# Patient Record
Sex: Female | Born: 1960
Health system: Southern US, Community
[De-identification: ages and names within clinical notes are randomized; demographics above are authoritative.]

## PROBLEM LIST (undated history)

## (undated) DIAGNOSIS — I10 Essential (primary) hypertension: Secondary | ICD-10-CM

## (undated) HISTORY — PX: MYOMECTOMY: SHX85

## (undated) HISTORY — PX: APPENDECTOMY: SHX54

---

## 1998-03-12 ENCOUNTER — Inpatient Hospital Stay (HOSPITAL_COMMUNITY): Admission: RE | Admit: 1998-03-12 | Discharge: 1998-03-14 | Payer: Self-pay | Admitting: Obstetrics and Gynecology

## 1999-04-23 ENCOUNTER — Other Ambulatory Visit: Admission: RE | Admit: 1999-04-23 | Discharge: 1999-04-23 | Payer: Self-pay | Admitting: Obstetrics and Gynecology

## 1999-04-23 ENCOUNTER — Encounter: Admission: RE | Admit: 1999-04-23 | Discharge: 1999-04-23 | Payer: Self-pay | Admitting: Obstetrics and Gynecology

## 1999-04-23 ENCOUNTER — Encounter: Payer: Self-pay | Admitting: Obstetrics and Gynecology

## 2000-04-25 ENCOUNTER — Encounter: Payer: Self-pay | Admitting: Family Medicine

## 2000-04-25 ENCOUNTER — Encounter: Admission: RE | Admit: 2000-04-25 | Discharge: 2000-04-25 | Payer: Self-pay | Admitting: Family Medicine

## 2000-04-28 ENCOUNTER — Other Ambulatory Visit: Admission: RE | Admit: 2000-04-28 | Discharge: 2000-04-28 | Payer: Self-pay | Admitting: Obstetrics and Gynecology

## 2000-05-03 ENCOUNTER — Encounter: Admission: RE | Admit: 2000-05-03 | Discharge: 2000-05-03 | Payer: Self-pay | Admitting: Obstetrics and Gynecology

## 2000-05-03 ENCOUNTER — Encounter: Payer: Self-pay | Admitting: Obstetrics and Gynecology

## 2001-04-26 ENCOUNTER — Encounter: Payer: Self-pay | Admitting: Obstetrics and Gynecology

## 2001-04-26 ENCOUNTER — Encounter: Admission: RE | Admit: 2001-04-26 | Discharge: 2001-04-26 | Payer: Self-pay | Admitting: Obstetrics and Gynecology

## 2001-05-01 ENCOUNTER — Other Ambulatory Visit: Admission: RE | Admit: 2001-05-01 | Discharge: 2001-05-01 | Payer: Self-pay | Admitting: Obstetrics and Gynecology

## 2002-03-08 ENCOUNTER — Ambulatory Visit (HOSPITAL_BASED_OUTPATIENT_CLINIC_OR_DEPARTMENT_OTHER): Admission: RE | Admit: 2002-03-08 | Discharge: 2002-03-08 | Payer: Self-pay | Admitting: Obstetrics and Gynecology

## 2002-03-08 ENCOUNTER — Encounter (INDEPENDENT_AMBULATORY_CARE_PROVIDER_SITE_OTHER): Payer: Self-pay | Admitting: Specialist

## 2002-05-01 ENCOUNTER — Encounter: Payer: Self-pay | Admitting: Obstetrics and Gynecology

## 2002-05-01 ENCOUNTER — Encounter: Admission: RE | Admit: 2002-05-01 | Discharge: 2002-05-01 | Payer: Self-pay | Admitting: Obstetrics and Gynecology

## 2002-06-05 ENCOUNTER — Other Ambulatory Visit: Admission: RE | Admit: 2002-06-05 | Discharge: 2002-06-05 | Payer: Self-pay | Admitting: Obstetrics and Gynecology

## 2003-05-30 ENCOUNTER — Encounter: Admission: RE | Admit: 2003-05-30 | Discharge: 2003-05-30 | Payer: Self-pay | Admitting: Obstetrics and Gynecology

## 2003-06-06 ENCOUNTER — Other Ambulatory Visit: Admission: RE | Admit: 2003-06-06 | Discharge: 2003-06-06 | Payer: Self-pay | Admitting: Obstetrics and Gynecology

## 2003-06-10 ENCOUNTER — Encounter: Admission: RE | Admit: 2003-06-10 | Discharge: 2003-06-10 | Payer: Self-pay | Admitting: Obstetrics and Gynecology

## 2004-06-09 ENCOUNTER — Encounter: Admission: RE | Admit: 2004-06-09 | Discharge: 2004-06-09 | Payer: Self-pay | Admitting: Obstetrics and Gynecology

## 2005-07-12 ENCOUNTER — Encounter: Admission: RE | Admit: 2005-07-12 | Discharge: 2005-07-12 | Payer: Self-pay | Admitting: Obstetrics and Gynecology

## 2006-07-25 ENCOUNTER — Encounter: Admission: RE | Admit: 2006-07-25 | Discharge: 2006-07-25 | Payer: Self-pay | Admitting: Obstetrics and Gynecology

## 2006-07-29 ENCOUNTER — Encounter: Admission: RE | Admit: 2006-07-29 | Discharge: 2006-07-29 | Payer: Self-pay | Admitting: Obstetrics and Gynecology

## 2006-08-15 ENCOUNTER — Encounter: Admission: RE | Admit: 2006-08-15 | Discharge: 2006-08-15 | Payer: Self-pay | Admitting: Obstetrics and Gynecology

## 2007-08-04 ENCOUNTER — Encounter: Admission: RE | Admit: 2007-08-04 | Discharge: 2007-08-04 | Payer: Self-pay | Admitting: Obstetrics and Gynecology

## 2008-08-13 ENCOUNTER — Encounter: Admission: RE | Admit: 2008-08-13 | Discharge: 2008-08-13 | Payer: Self-pay | Admitting: Family Medicine

## 2009-08-19 ENCOUNTER — Encounter: Admission: RE | Admit: 2009-08-19 | Discharge: 2009-08-19 | Payer: Self-pay | Admitting: Obstetrics and Gynecology

## 2009-09-18 ENCOUNTER — Encounter: Admission: RE | Admit: 2009-09-18 | Discharge: 2009-09-18 | Payer: Self-pay | Admitting: Obstetrics and Gynecology

## 2009-11-19 ENCOUNTER — Emergency Department (HOSPITAL_COMMUNITY): Admission: EM | Admit: 2009-11-19 | Discharge: 2009-11-19 | Payer: Self-pay | Admitting: Emergency Medicine

## 2010-03-01 ENCOUNTER — Encounter: Payer: Self-pay | Admitting: Obstetrics and Gynecology

## 2010-04-21 ENCOUNTER — Observation Stay (HOSPITAL_COMMUNITY)
Admission: EM | Admit: 2010-04-21 | Discharge: 2010-04-23 | Disposition: A | Discharge status: Home / Self Care | Payer: BC Managed Care – PPO | Attending: General Surgery | Admitting: General Surgery

## 2010-04-21 ENCOUNTER — Other Ambulatory Visit: Payer: Self-pay | Admitting: General Surgery

## 2010-04-21 ENCOUNTER — Emergency Department (HOSPITAL_COMMUNITY): Payer: BC Managed Care – PPO

## 2010-04-21 DIAGNOSIS — R509 Fever, unspecified: Secondary | ICD-10-CM | POA: Insufficient documentation

## 2010-04-21 DIAGNOSIS — K3580 Unspecified acute appendicitis: Principal | ICD-10-CM | POA: Insufficient documentation

## 2010-04-21 LAB — DIFFERENTIAL
Basophils Absolute: 0.1 10*3/uL (ref 0.0–0.1)
Basophils Relative: 0 % (ref 0–1)
Eosinophils Absolute: 0 10*3/uL (ref 0.0–0.7)
Eosinophils Relative: 0 % (ref 0–5)
Lymphocytes Relative: 6 % — ABNORMAL LOW (ref 12–46)
Lymphs Abs: 1.2 10*3/uL (ref 0.7–4.0)
Monocytes Absolute: 1.2 10*3/uL — ABNORMAL HIGH (ref 0.1–1.0)
Monocytes Relative: 7 % (ref 3–12)
Neutro Abs: 16.4 10*3/uL — ABNORMAL HIGH (ref 1.7–7.7)
Neutrophils Relative %: 87 % — ABNORMAL HIGH (ref 43–77)

## 2010-04-21 LAB — CBC
HCT: 38.7 % (ref 36.0–46.0)
Hemoglobin: 12.8 g/dL (ref 12.0–15.0)
MCH: 28.5 pg (ref 26.0–34.0)
MCHC: 33.1 g/dL (ref 30.0–36.0)
MCV: 86.2 fL (ref 78.0–100.0)
Platelets: 240 10*3/uL (ref 150–400)
RBC: 4.49 MIL/uL (ref 3.87–5.11)
RDW: 13.5 % (ref 11.5–15.5)
WBC: 18.9 10*3/uL — ABNORMAL HIGH (ref 4.0–10.5)

## 2010-04-21 LAB — COMPREHENSIVE METABOLIC PANEL
ALT: 15 U/L (ref 0–35)
AST: 17 U/L (ref 0–37)
Albumin: 3.6 g/dL (ref 3.5–5.2)
Alkaline Phosphatase: 52 U/L (ref 39–117)
BUN: 8 mg/dL (ref 6–23)
CO2: 26 mEq/L (ref 19–32)
Calcium: 9 mg/dL (ref 8.4–10.5)
Chloride: 102 mEq/L (ref 96–112)
Creatinine, Ser: 0.78 mg/dL (ref 0.4–1.2)
GFR calc Af Amer: >60 mL/min (ref >60)
GFR calc non Af Amer: >60 mL/min (ref >60)
Glucose, Bld: 121 mg/dL — ABNORMAL HIGH (ref 70–99)
Potassium: 3.8 mEq/L (ref 3.5–5.1)
Sodium: 136 mEq/L (ref 135–145)
Total Bilirubin: 0.6 mg/dL (ref 0.3–1.2)
Total Protein: 7.4 g/dL (ref 6.0–8.3)

## 2010-04-21 LAB — URINALYSIS, ROUTINE W REFLEX MICROSCOPIC
Bilirubin Urine: NEGATIVE
Glucose, UA: NEGATIVE mg/dL
Ketones, ur: 40 mg/dL — AB
Leukocytes, UA: NEGATIVE
Nitrite: NEGATIVE
Protein, ur: 30 mg/dL — AB
Specific Gravity, Urine: 1.026 (ref 1.005–1.030)
Urobilinogen, UA: 0.2 mg/dL (ref 0.0–1.0)
pH: 5.5 (ref 5.0–8.0)

## 2010-04-21 LAB — URINE MICROSCOPIC-ADD ON

## 2010-04-21 LAB — LIPASE, BLOOD: Lipase: 19 U/L (ref 11–59)

## 2010-04-21 MED ORDER — IOHEXOL 300 MG/ML  SOLN
70.0000 mL | Freq: Once | INTRAMUSCULAR | Status: AC | PRN
  Administered 2010-04-21: 70 mL via INTRAVENOUS

## 2010-04-22 LAB — URINE CULTURE
Colony Count: NO GROWTH
Culture  Setup Time: 201203132156
Culture: NO GROWTH

## 2010-05-04 NOTE — Discharge Summary (Signed)
  Elizabeth Cochran, Elizabeth Cochran              ACCOUNT NO.:  [id number]  MEDICAL RECORD NO.:  [id number]           PATIENT TYPE:  I  LOCATION:  5031                         FACILITY:  [facility]  PHYSICIAN:  Ollen Gross. Vernell Morgans, M.D. DATE OF BIRTH:  02-03-1961  DATE OF ADMISSION:  04/21/2010 DATE OF DISCHARGE:                              DISCHARGE SUMMARY   ADMISSION DIAGNOSIS:  Acute appendicitis.  DISCHARGE DIAGNOSIS:  Acute appendicitis.  PROCEDURES:  Laparoscopic appendectomy, April 21, 2010.  BRIEF HISTORY:  The patient is a 50 year old black female who presented with 3 days of right lower quadrant pain.  She had fevers and chills at home.  She had some nausea and vomiting at home.  Pain got worse and she presented to the ER where she underwent CT scan which showed appendicitis.  She was then seen in consultation by Dr. Carolynne Edouard who recommended laparoscopic appendectomy at that time to prevent perforation.  The patient agreed and was taken to the OR.  First postoperative morning, she had been back in her room in few hours and was starting some p.o.'s but had not ambulated.  We gave her time to ambulate and increase her p.o. intake.  She has made good progress and we anticipate her discharge in the a.m., April 23, 2010.  DISCHARGE MEDICATIONS:  She will go home on her preadmission medicines which include ibuprofen p.r.n., multivitamin 1 daily, progesterone in oil 200 mg daily, Vivelle dot 0.075 mcg q.24 h., and Zyrtec 1 tablet p.o. daily.  She will go home on Percocet 1-2 p.o. q.4 h. p.r.n. for pain and/or p.r.n., Tylenol 2 tablets p.o. q.4 p.r.n.  FOLLOWUP:  She will return for a followup in the [facility] on May 05, 2010 at 2:30 p.m.  She is instructed to call if she has any problems with fever, increased abdominal pain, or generally feeling bad and she is not sure why.  CONDITION ON DISCHARGE:  Improved.     Eber Hong, P.A.   ______________________________ Ollen Gross. Vernell Morgans, M.D.    WDJ/MEDQ  D:  04/22/2010  T:  04/23/2010  Job:  [id number]  cc:   Lupita Raider, M.D.  Electronically Signed by Sherrie George P.A. on 04/28/2010 04:46:10 PM Electronically Signed by Chevis Pretty III M.D. on 05/04/2010 07:33:37 AM

## 2010-05-04 NOTE — H&P (Signed)
  Elizabeth Cochran              ACCOUNT NO.:  [id number]  MEDICAL RECORD NO.:  [id number]           PATIENT TYPE:  I  LOCATION:  5031                         FACILITY:  [facility]  PHYSICIAN:  Ollen Gross. Vernell Morgans, M.D. DATE OF BIRTH:  [birth year]  DATE OF ADMISSION:  04/21/2010 DATE OF DISCHARGE:                             HISTORY & PHYSICAL   [name] is a 50 year old black female who presents to the emergency room with a 3-day history right lower quadrant pain.  She has had fevers and chills at home.  She has had nausea and vomiting at home.  Her pain has gotten worse and she came to the ER.  She denies any chest pain or shortness of breath.  No diarrhea or dysuria.  Her other review of systems are unremarkable.  PAST MEDICAL HISTORY:  None.  PAST SURGICAL HISTORY:  Significant for fibroid removal.  MEDICATIONS:  Flexeril, ibuprofen, Vivelle, Prometrium and Zyrtec.  ALLERGIES:  To VICODIN.  SOCIAL HISTORY:  She denies use of alcohol or tobacco products.  FAMILY HISTORY:  Noncontributory.  PHYSICAL EXAMINATION:  VITAL SIGNS:  Her temperature is 98.7, pulse 97, blood pressure 135/85. GENERAL:  She is well-developed, well-nourished black female in no acute distress. SKIN:  Warm and dry.  No jaundice. EYES:  Her extraocular muscles are intact.  Pupils are equal, round, and reactive to light.  Sclerae nonicteric. LUNGS:  Clear bilaterally with no use of accessory respiratory muscles. HEART:  Regular rate and rhythm with an impulse in left chest. ABDOMEN:  Soft with some focal moderate right lower quadrant tenderness. No guarding or peritonitis.  No palpable mass or hepatosplenomegaly. EXTREMITIES:  No cyanosis, clubbing, or edema with good strength in arms and legs.  PSYCHOLOGIC:  She is alert and oriented x3 with no evidence of anxiety or depression.  On review of her lab work, her white count was 18.9.  CT scan showed an enlarged, inflamed appendix.  ASSESSMENT/PLAN:   This is a 50 year old white black female with acute appendicitis.  Because of the risk of perforation of the sepsis, I think she would benefit from having her appendix removed.  She would also like to have this done.  I have explained her in detail the risks and benefits of the operation to remove the appendix as well as some of the technical aspects and she understands and wishes to proceed.  We will plan to do this tonight for in the operating room.     Ollen Gross. Vernell Morgans, M.D.     PST/MEDQ  D:  04/22/2010  T:  04/22/2010  Job:  [id number]  Electronically Signed by Chevis Pretty III M.D. on 05/04/2010 07:33:27 AM

## 2010-05-04 NOTE — Op Note (Signed)
Elizabeth Cochran, CIHLAR              ACCOUNT NO.:  [id number]  MEDICAL RECORD NO.:  [id number]           PATIENT TYPE:  I  LOCATION:  5031                         FACILITY:  [facility]  PHYSICIAN:  Ollen Gross. Vernell Morgans, M.D. DATE OF BIRTH:  1960/09/15  DATE OF PROCEDURE:  04/21/2010 DATE OF DISCHARGE:                              OPERATIVE REPORT   PREOPERATIVE DIAGNOSIS:  Acute appendicitis.  POSTOPERATIVE DIAGNOSIS:  Acute appendicitis.  PROCEDURE:  Laparoscopic appendectomy.  SURGEON:  Acute appendicitis.  ANESTHESIA:  General endotracheal.  PROCEDURE:  After informed consent was obtained, the patient was brought to the operating room and placed in supine position on the operating room table.  After adequate induction of general anesthesia, the patient's abdomen was prepped with ChloraPrep and allowed to dry and draped in the usual sterile manner.  The area below the umbilicus was infiltrated with 0.25% Marcaine.  A small incision was made with a #15 blade knife.  This incision was carried down through the subcutaneous tissue bluntly with a hemostat and [employer] retractors until the linea alba was identified.  The linea alba was incised with a #15 blade knife and each side was grasped with Kocher clamps and elevated anteriorly. The preperitoneal space was then probed bluntly with a hemostat until the peritoneum was opened and access was gained to the abdominal cavity. A 0 Vicryl purse-string stitch was placed in the fascia around the opening.  Hasson cannula was placed through the opening and anchored in place through previously placed Vicryl purse-strings stitch.  The abdomen was then insufflated with carbon dioxide without difficulty. The patient was placed in Trendelenburg position and rotated with the right side up.  Next, suprapubic region was infiltrated with 0.25% Marcaine.  A small incision was made with a #15 blade knife and a 10-mm port was placed bluntly through this  incision into the abdominal cavity under direct vision.  A site in the left upper quadrant was chosen for placement of a 5-mm port.  This area was infiltrated with 0.25% Marcaine.  A small stab incision was made with a #15 blade knife and a 5- mm port was placed bluntly through this incision into the abdominal cavity under direct vision.  The laparoscope was then moved to the suprapubic port using a Glassman grasper and harmonic scalpel.  The right lower quadrant was inspected.  We were able to identify and enlarged the inflamed appendix and the right gutter adherent to the retroperitoneum and abdominal wall.  We were able to bluntly free this up a fair bit.  We took down the mesoappendix sharply with the harmonic scalpel.  Once we were able to get the appendix up in the air, we were then able to clear the base of the appendix where it joined the cecum of any tissue also sharply with the harmonic scalpel.  We then placed a laparoscopic GIA blue load 45-mm 6-row stapler across the base of the appendix, clamped and fired it thereby dividing the base of the appendix between staple lines.  Laparoscopic bag was inserted through the Hasson cannula.  The appendix was placed in the bag and the  bag was sealed. The abdomen was then irrigated with copious amounts of saline.  The staple line was examined again and found to be hemostatic.  No other abnormalities were noted on general inspection of the abdomen.  The appendix and bag were removed with the Hasson cannula through the infraumbilical port without difficulty.  The fascial defect was closed with the previously placed Vicryl purse-string stitch as well as with another figure-of-eight 0 Vicryl stitch.  The rest of ports were removed under direct vision and were found to be hemostatic.  The gas was allowed to escape.  The skin incisions were all closed with interrupted 4-0 Monocryl subcuticular stitches. Dermabond dressings were applied.  The  patient tolerated the procedure well.  At the end of the case, all needle, sponge, and instrument counts were correct.  The patient was then awakened and taken to the recovery in stable condition.     Ollen Gross. Vernell Morgans, M.D.     PST/MEDQ  D:  04/22/2010  T:  04/22/2010  Job:  [id number]  Electronically Signed by Chevis Pretty III M.D. on 05/04/2010 07:33:34 AM

## 2010-06-26 NOTE — Op Note (Signed)
NAME:  Elizabeth Cochran, Elizabeth Cochran                        ACCOUNT NO.:  [id number]   MEDICAL RECORD NO.:  [id number]                   PATIENT TYPE:  AMB   LOCATION:  [facility]                                 FACILITY:  [facility]   PHYSICIAN:  Sherry A. Rosalio Macadamia, M.D.           DATE OF BIRTH:  07-Dec-1960   DATE OF PROCEDURE:  03/08/2002  DATE OF DISCHARGE:                                 OPERATIVE REPORT   PREOPERATIVE DIAGNOSES:  Menorrhagia, submucosal fibroid.   POSTOPERATIVE DIAGNOSES:  Menorrhagia, submucosal fibroid.   PROCEDURE:  D&C hysteroscopy with the resectoscope.   SURGEON:  Sherry A. Rosalio Macadamia, M.D.   ANESTHESIA:  MAC.   INDICATIONS FOR PROCEDURE:  This is a 50 year old, G3, P1-0-2-1 woman who  has been having increasing vaginal bleeding with a known fibroid uterus. The  patient had multiple myomectomies in February of 2004 and the patient had no  significant problems with her menstrual periods until the past three months.  Ultrasound was performed which revealed a submucosal fibroid as well as  multiple other fibroids present. The patient's hemoglobin showed a low of  9.7.  The patient was treated hormonally to get her bleeding to stop which  was very difficult using both birth control pills and Provera. Now that her  bleeding stopped, the patient is brought into the operating room for [facility]  hysteroscopy with the resectoscope.   FINDINGS:  A 9 week size retroverted irregular uterus with large submucosal  fibroid present with other fibroid becoming submucosal during surgery.   DESCRIPTION OF PROCEDURE:  The patient was brought into the operating room,  given adequate IV sedation. She was placed in the dorsal lithotomy position.  Her perineum and vagina were washed with Hibiclens. A pelvic examination was  performed. The patient was draped in a sterile fashion. A speculum was  placed within the vagina. The vagina was washed with Hibiclens. Paracervical  block was administered with  1% Nesacaine. The anterior lip of the cervix was  grasped with a single tooth tenaculum. The cervix was sounded. The cervix  was dilated with Pratt dilators to a #31. The hysteroscope was introduced  into the endometrial cavity, pictures were obtained. The submucosal fibroid  was removed. As the fibroid was removed, other fibroids inverted into the  endometrial cavity and became submucosal. These attempted to be shaved off  just to remain within the endometrial cavity. No tissue was dug deeply to be  removed. This procedure took a long time because of the extensiveness of the  tissue, approximately 2 1/2 hours to complete this procedure. Once during  the very end of the procedure while tissue was being removed but during  cauterization, the sorbitol measuring unit showed a significant increase in  the differential therefore a possible small perforation was present and the  procedure was stopped. No further cautery or hysteroscopy was performed. The  hysteroscope was removed, speculum was placed within the vagina. Tissue that  has already been cauterized was removed from the endometrial cavity with  polyp forceps. There is no significant amount of bleeding. The tenaculum is  removed from the cervix. The cervix was cauterized with Monsel solution to  stop any bleeding. The bladder was in and out catheterized for 500 cc of  urine after the speculum had been removed. Pelvic exam revealed a  significantly decreased size uterus. The patient was felt to be stable. All  instruments  removed from the vagina. The patient was taken out of the dorsal lithotomy  position. She was moved from the operating table to a stretcher in stable  condition. Complications were none. Estimated blood loss 25 cc. Sorbitol  differential -300 cc.                                               Sherry A. Rosalio Macadamia, M.D.    SAD/MEDQ  D:  03/08/2002  T:  03/08/2002  Job:  [id number]

## 2010-08-10 ENCOUNTER — Other Ambulatory Visit: Payer: Self-pay | Admitting: Obstetrics and Gynecology

## 2010-08-10 DIAGNOSIS — Z1231 Encounter for screening mammogram for malignant neoplasm of breast: Secondary | ICD-10-CM

## 2010-09-21 ENCOUNTER — Ambulatory Visit
Admission: RE | Admit: 2010-09-21 | Discharge: 2010-09-21 | Disposition: A | Discharge status: Home / Self Care | Payer: BC Managed Care – PPO | Source: Ambulatory Visit | Attending: Obstetrics and Gynecology | Admitting: Obstetrics and Gynecology

## 2010-09-21 ENCOUNTER — Ambulatory Visit: Payer: BC Managed Care – PPO

## 2010-09-21 DIAGNOSIS — Z1231 Encounter for screening mammogram for malignant neoplasm of breast: Secondary | ICD-10-CM

## 2011-09-02 ENCOUNTER — Other Ambulatory Visit: Payer: Self-pay | Admitting: Obstetrics and Gynecology

## 2011-09-02 DIAGNOSIS — Z1231 Encounter for screening mammogram for malignant neoplasm of breast: Secondary | ICD-10-CM

## 2011-09-22 ENCOUNTER — Ambulatory Visit: Payer: BC Managed Care – PPO

## 2011-09-24 ENCOUNTER — Ambulatory Visit
Admission: RE | Admit: 2011-09-24 | Discharge: 2011-09-24 | Disposition: A | Discharge status: Home / Self Care | Payer: BC Managed Care – PPO | Source: Ambulatory Visit | Attending: Obstetrics and Gynecology | Admitting: Obstetrics and Gynecology

## 2011-09-24 DIAGNOSIS — Z1231 Encounter for screening mammogram for malignant neoplasm of breast: Secondary | ICD-10-CM

## 2012-08-25 ENCOUNTER — Other Ambulatory Visit: Payer: Self-pay

## 2012-08-25 DIAGNOSIS — Z1231 Encounter for screening mammogram for malignant neoplasm of breast: Secondary | ICD-10-CM

## 2012-09-25 ENCOUNTER — Ambulatory Visit: Payer: BC Managed Care – PPO

## 2012-09-28 ENCOUNTER — Ambulatory Visit: Payer: BC Managed Care – PPO

## 2012-11-07 ENCOUNTER — Ambulatory Visit: Payer: BC Managed Care – PPO

## 2012-12-07 ENCOUNTER — Ambulatory Visit
Admission: RE | Admit: 2012-12-07 | Discharge: 2012-12-07 | Disposition: A | Discharge status: Home / Self Care | Payer: BC Managed Care – PPO | Source: Ambulatory Visit

## 2012-12-07 DIAGNOSIS — Z1231 Encounter for screening mammogram for malignant neoplasm of breast: Secondary | ICD-10-CM

## 2013-10-22 ENCOUNTER — Other Ambulatory Visit: Payer: Self-pay

## 2013-10-22 DIAGNOSIS — Z1231 Encounter for screening mammogram for malignant neoplasm of breast: Secondary | ICD-10-CM

## 2013-12-14 ENCOUNTER — Ambulatory Visit
Admission: RE | Admit: 2013-12-14 | Discharge: 2013-12-14 | Disposition: A | Discharge status: Home / Self Care | Payer: BC Managed Care – PPO | Source: Ambulatory Visit

## 2013-12-14 DIAGNOSIS — Z1231 Encounter for screening mammogram for malignant neoplasm of breast: Secondary | ICD-10-CM

## 2014-03-08 ENCOUNTER — Ambulatory Visit: Payer: Self-pay | Admitting: *Deleted

## 2014-12-11 ENCOUNTER — Other Ambulatory Visit: Payer: Self-pay

## 2014-12-11 DIAGNOSIS — Z1231 Encounter for screening mammogram for malignant neoplasm of breast: Secondary | ICD-10-CM

## 2015-01-20 ENCOUNTER — Ambulatory Visit
Admission: RE | Admit: 2015-01-20 | Discharge: 2015-01-20 | Disposition: A | Discharge status: Home / Self Care | Payer: BLUE CROSS/BLUE SHIELD | Source: Ambulatory Visit

## 2015-01-20 DIAGNOSIS — Z1231 Encounter for screening mammogram for malignant neoplasm of breast: Secondary | ICD-10-CM

## 2015-06-02 DIAGNOSIS — I10 Essential (primary) hypertension: Secondary | ICD-10-CM | POA: Diagnosis not present

## 2015-06-02 DIAGNOSIS — M542 Cervicalgia: Secondary | ICD-10-CM | POA: Diagnosis not present

## 2015-07-02 DIAGNOSIS — J3089 Other allergic rhinitis: Secondary | ICD-10-CM | POA: Diagnosis not present

## 2015-07-04 DIAGNOSIS — G5601 Carpal tunnel syndrome, right upper limb: Secondary | ICD-10-CM | POA: Diagnosis not present

## 2015-07-04 DIAGNOSIS — I10 Essential (primary) hypertension: Secondary | ICD-10-CM | POA: Diagnosis not present

## 2015-07-23 DIAGNOSIS — G5600 Carpal tunnel syndrome, unspecified upper limb: Secondary | ICD-10-CM | POA: Diagnosis not present

## 2015-08-13 DIAGNOSIS — M5412 Radiculopathy, cervical region: Secondary | ICD-10-CM | POA: Diagnosis not present

## 2015-08-20 DIAGNOSIS — M5412 Radiculopathy, cervical region: Secondary | ICD-10-CM | POA: Diagnosis not present

## 2015-08-22 DIAGNOSIS — M5412 Radiculopathy, cervical region: Secondary | ICD-10-CM | POA: Diagnosis not present

## 2015-09-10 DIAGNOSIS — M5412 Radiculopathy, cervical region: Secondary | ICD-10-CM | POA: Diagnosis not present

## 2015-09-22 DIAGNOSIS — I10 Essential (primary) hypertension: Secondary | ICD-10-CM | POA: Diagnosis not present

## 2015-09-22 DIAGNOSIS — J3089 Other allergic rhinitis: Secondary | ICD-10-CM | POA: Diagnosis not present

## 2015-09-23 DIAGNOSIS — M5412 Radiculopathy, cervical region: Secondary | ICD-10-CM | POA: Diagnosis not present

## 2015-09-23 DIAGNOSIS — R200 Anesthesia of skin: Secondary | ICD-10-CM | POA: Diagnosis not present

## 2015-09-24 DIAGNOSIS — G5601 Carpal tunnel syndrome, right upper limb: Secondary | ICD-10-CM | POA: Diagnosis not present

## 2015-09-24 DIAGNOSIS — Z1211 Encounter for screening for malignant neoplasm of colon: Secondary | ICD-10-CM | POA: Diagnosis not present

## 2015-09-24 DIAGNOSIS — Z0000 Encounter for general adult medical examination without abnormal findings: Secondary | ICD-10-CM | POA: Diagnosis not present

## 2015-09-24 DIAGNOSIS — I10 Essential (primary) hypertension: Secondary | ICD-10-CM | POA: Diagnosis not present

## 2015-09-24 DIAGNOSIS — Z23 Encounter for immunization: Secondary | ICD-10-CM | POA: Diagnosis not present

## 2015-09-24 DIAGNOSIS — E663 Overweight: Secondary | ICD-10-CM | POA: Diagnosis not present

## 2015-10-23 DIAGNOSIS — M5412 Radiculopathy, cervical region: Secondary | ICD-10-CM | POA: Diagnosis not present

## 2015-11-14 DIAGNOSIS — Z23 Encounter for immunization: Secondary | ICD-10-CM | POA: Diagnosis not present

## 2015-11-15 DIAGNOSIS — M542 Cervicalgia: Secondary | ICD-10-CM | POA: Diagnosis not present

## 2015-11-26 DIAGNOSIS — M5412 Radiculopathy, cervical region: Secondary | ICD-10-CM | POA: Diagnosis not present

## 2015-12-23 ENCOUNTER — Other Ambulatory Visit: Payer: Self-pay | Admitting: Obstetrics and Gynecology

## 2015-12-23 DIAGNOSIS — Z1231 Encounter for screening mammogram for malignant neoplasm of breast: Secondary | ICD-10-CM

## 2016-01-04 ENCOUNTER — Encounter: Payer: Self-pay | Admitting: Emergency Medicine

## 2016-01-04 ENCOUNTER — Emergency Department
Admission: EM | Admit: 2016-01-04 | Discharge: 2016-01-04 | Disposition: A | Discharge status: Home / Self Care | Payer: BLUE CROSS/BLUE SHIELD | Source: Home / Self Care | Attending: Emergency Medicine | Admitting: Emergency Medicine

## 2016-01-04 ENCOUNTER — Other Ambulatory Visit: Payer: Self-pay

## 2016-01-04 DIAGNOSIS — R55 Syncope and collapse: Secondary | ICD-10-CM

## 2016-01-04 DIAGNOSIS — R110 Nausea: Secondary | ICD-10-CM

## 2016-01-04 HISTORY — DX: Essential (primary) hypertension: I10

## 2016-01-04 MED ORDER — ONDANSETRON 4 MG PO TBDP: 4.0000 mg | ORAL_TABLET | Freq: Three times a day (TID) | ORAL | 0 refills | Status: AC | PRN

## 2016-01-04 MED ORDER — ONDANSETRON 4 MG PO TBDP: 4.0000 mg | ORAL_TABLET | ORAL | Status: DC

## 2016-01-04 NOTE — Discharge Instructions (Signed)
Today, your EKG was normal without any sign of a heart attack or skipped beat. Physical exam was within normal limits. No evidence of heart attack or stroke. You likely either have a mild virus or nausea caused by holiday foods. I advise clear liquids, advance to bland food. Prescription for Zofran ODT if needed for nausea ( same as the one pill given here in urgent care)

## 2016-01-04 NOTE — ED Provider Notes (Signed)
[facility]    CSN: [id number] Arrival date & time: 01/04/16  1801 [facility] Sunday, 6:20 PM Here with husband   History   Chief Complaint Chief Complaint  Patient presents with  . Dizziness  . Nausea    HPI Elizabeth Cochran is a 55 y.o. female.   HPI 55 year old female complains of 4 days of intermittent, mild nausea, "queasiness", and lightheadedness and vague nonfocal weakness, without vertigo or syncope or any new focal neurologic symptoms. Current diagnosis of cervical disc impingement in her neck for many months or longer, which causes occasional intermittent numbness right second and third fingers. 4 days ago the nausea was the worst, and she vomited 2, nonbloody non-bile vomitus. Since then, has not vomited at all. Denies abdominal pain, reflux symptoms, blood in stool, melena, diarrhea, or change of bowel habits. She denies any chest pain or shortness of breath. However, she states that she is worried about ruling out heart attack, as there is a family history of heart disease in 3 aunts. She denies exertional chest pain or shortness of breath or neurologic symptoms. Denies headache or vision problem. No URI sxs. Denies chance of pregnancy, LMP 2009. She's had mild decreased appetite, but able to tolerate liquids and solids for the past 3 days. Denies fever or chills. No rash. No definite myalgias. She has not tried any particular medication or treatment for this. Past Medical History:  Diagnosis Date  . Hypertension   hypertension has been controlled on losartan once daily.  She is also on gabapentin 3 times a day and meloxicam for cervical disc. She states she doesn't think the gabapentin ormeloxicam  is associated with, or causing her symptoms, as she stopped gabapentin and meloxicam for the past few days, and that was not associated with her sxs.  He denies any GYN or GU symptoms  There are no active problems to  display for this patient.   Past Surgical History:  Procedure Laterality Date  . APPENDECTOMY    . MYOMECTOMY      OB History    No data available    LMP 2009   Home Medications    Prior to Admission medications   Medication Sig Start Date End Date Taking? Authorizing Provider  gabapentin (NEURONTIN) 100 MG capsule Take 100 mg by mouth 3 (three) times daily.   Yes Historical Provider, MD  losartan (COZAAR) 25 MG tablet Take 25 mg by mouth daily.   Yes Historical Provider, MD  meloxicam (MOBIC) 15 MG tablet Take 15 mg by mouth daily.   Yes Historical Provider, MD  ondansetron (ZOFRAN-ODT) 4 MG disintegrating tablet Take 1 tablet (4 mg total) by mouth every 8 (eight) hours as needed for nausea or vomiting. 01/04/16   Lajean Manesavid Massey, MD    Family History Family History  Problem Relation Age of Onset  . Cancer Mother   . Heart failure Sister   +family history of heart disease in 3 aunts.   Social History Social History  Substance Use Topics  . Smoking status: Never Smoker  . Smokeless tobacco: Never Used  . Alcohol use No     Allergies   Patient has no known allergies.   Review of Systems Review of Systems  All other systems reviewed and are negative. (see hpi)   Physical Exam Triage Vital Signs ED Triage Vitals  Enc Vitals Group     BP      Pulse      Resp  Temp      Temp src      SpO2      Weight      Height      Head Circumference      Peak Flow      Pain Score      Pain Loc      Pain Edu?      Excl. in GC?    No data found.   Updated Vital Signs BP 124/73 (BP Location: Left Arm)   Pulse 101   Temp 98 F (36.7 C) (Oral)   Resp 16   Ht 5\' 4"  (1.626 m)   Wt 173 lb (78.5 kg)   SpO2 96%   BMI 29.70 kg/m   Pulse repeated 80. No orthostatic BP change. No symptoms upon going from sitting to standing/dm  Visual acuity: Right Eye Near:  Grossly within normal limits Left Eye Near:    Grossly within normal limits     Physical Exam    Constitutional: She is oriented to person, place, and time. She appears well-developed and well-nourished. No distress.  HENT:  Head: Normocephalic and atraumatic.  Mouth/Throat: Oropharynx is clear and moist.  Eyes: Conjunctivae and EOM are normal. Pupils are equal, round, and reactive to light.  Neck: Normal range of motion. Neck supple. No JVD present. No tracheal deviation present. No thyromegaly present.  Cardiovascular: Normal rate, regular rhythm and normal heart sounds.  Exam reveals no gallop and no friction rub.   No murmur heard. Pulmonary/Chest: Effort normal and breath sounds normal. No stridor. She has no rales.  Abdominal: Soft. Bowel sounds are normal. She exhibits no distension and no mass. There is no tenderness. There is no rebound and no guarding.  Musculoskeletal: Normal range of motion. She exhibits no edema or tenderness.  Lymphadenopathy:    She has no cervical adenopathy.  Neurological: She is alert and oriented to person, place, and time. She displays normal reflexes. No cranial nerve deficit or sensory deficit. She exhibits normal muscle tone. Coordination normal.  Motor and sensory exam upper and lower extremities intact bilaterally.  Skin: Skin is warm and dry. Capillary refill takes less than 2 seconds. No rash noted. She is not diaphoretic.  Psychiatric: She has a normal mood and affect.  Nursing note and vitals reviewed.    UC Treatments / Results  Labs (all labs ordered are listed, but only abnormal results are displayed) Labs Reviewed - No data to display  EKG  EKG Interpretation None       Radiology No results found.  Procedures ED EKG Date/Time: 01/04/2016 6:38 PM Performed by: Georgina Pillion, DAVID Authorized by: Georgina Pillion, DAVID   Previous ECG:    Previous ECG:  Unavailable Interpretation:    Interpretation: normal   Rate:    ECG rate assessment comment:  80 Rhythm:    Rhythm: sinus rhythm   Ectopy:    Ectopy: none   QRS:    QRS axis:   Normal Conduction:    Conduction: normal   ST segments:    ST segments:  Normal T waves:    T waves: normal   Q waves:    Q wave noted on lead: none.   (including critical care time)  Medications Ordered in UC Medications  ondansetron (ZOFRAN-ODT) disintegrating tablet 4 mg (not administered)     Initial Impression / Assessment and Plan / UC Course  I have reviewed the triage vital signs and the nursing notes.  Pertinent labs & imaging results  that were available during my care of the patient were reviewed by me and considered in my medical decision making (see chart for details).  Clinical Course    She was given Zofran ODT 4 mg by mouth stat, and reevaluated, and nausea improved.  There is no clinical evidence of acute cardiac or neurologic event. Her nausea and vague lightheadedness could have been caused by dietary indiscretion or viral syndrome. However, she is improved now, and no further workup is indicated nor desired by patient at this time.  Final Clinical Impressions(s) / UC Diagnoses   Final diagnoses:  Nausea  Near syncope   Treatment options discussed, as well as risks, benefits, alternatives. Patient voiced understanding and agreement with the following plans: Rest, push clear liquids and advance to bland diet. Prescription for Zofran ODT, prn nausea. Follow-up with your primary care doctor in 5-7 days if not improving, or sooner if symptoms become worse. Precautions discussed. Red flags discussed.-Emergency room if any red flag  An After Visit Summary was printed and given to the patient. Questions invited and answered. Patient voiced understanding and agreement.   New Prescriptions Discharge Medication List as of 01/04/2016  7:12 PM    START taking these medications   Details  ondansetron (ZOFRAN-ODT) 4 MG disintegrating tablet Take 1 tablet (4 mg total) by mouth every 8 (eight) hours as needed for nausea or vomiting., Starting Sun 01/04/2016,  Print         Lajean Manesavid Massey, MD 01/04/16 1946

## 2016-01-04 NOTE — ED Triage Notes (Signed)
Reports onset of mild nausea, dizziness and weakness 4 days ago.

## 2016-01-09 DIAGNOSIS — Z760 Encounter for issue of repeat prescription: Secondary | ICD-10-CM | POA: Diagnosis not present

## 2016-01-09 DIAGNOSIS — J3089 Other allergic rhinitis: Secondary | ICD-10-CM | POA: Diagnosis not present

## 2016-01-28 ENCOUNTER — Ambulatory Visit
Admission: RE | Admit: 2016-01-28 | Discharge: 2016-01-28 | Disposition: A | Discharge status: Home / Self Care | Payer: BLUE CROSS/BLUE SHIELD | Source: Ambulatory Visit | Attending: Obstetrics and Gynecology | Admitting: Obstetrics and Gynecology

## 2016-01-28 DIAGNOSIS — Z1231 Encounter for screening mammogram for malignant neoplasm of breast: Secondary | ICD-10-CM | POA: Diagnosis not present

## 2016-02-12 DIAGNOSIS — Z6829 Body mass index (BMI) 29.0-29.9, adult: Secondary | ICD-10-CM | POA: Diagnosis not present

## 2016-02-12 DIAGNOSIS — Z1151 Encounter for screening for human papillomavirus (HPV): Secondary | ICD-10-CM | POA: Diagnosis not present

## 2016-02-12 DIAGNOSIS — Z01419 Encounter for gynecological examination (general) (routine) without abnormal findings: Secondary | ICD-10-CM | POA: Diagnosis not present

## 2016-02-27 ENCOUNTER — Ambulatory Visit
Admission: RE | Admit: 2016-02-27 | Discharge: 2016-02-27 | Disposition: A | Discharge status: Home / Self Care | Payer: BLUE CROSS/BLUE SHIELD | Source: Ambulatory Visit | Attending: Physician Assistant | Admitting: Physician Assistant

## 2016-02-27 ENCOUNTER — Other Ambulatory Visit: Payer: Self-pay | Admitting: Physician Assistant

## 2016-02-27 DIAGNOSIS — W19XXXA Unspecified fall, initial encounter: Secondary | ICD-10-CM | POA: Diagnosis not present

## 2016-02-27 DIAGNOSIS — S0990XA Unspecified injury of head, initial encounter: Secondary | ICD-10-CM | POA: Diagnosis not present

## 2016-02-27 DIAGNOSIS — S0993XA Unspecified injury of face, initial encounter: Secondary | ICD-10-CM | POA: Diagnosis not present

## 2016-02-27 DIAGNOSIS — S0012XA Contusion of left eyelid and periocular area, initial encounter: Secondary | ICD-10-CM | POA: Diagnosis not present

## 2016-03-03 DIAGNOSIS — Z0000 Encounter for general adult medical examination without abnormal findings: Secondary | ICD-10-CM | POA: Diagnosis not present

## 2016-04-08 DIAGNOSIS — I10 Essential (primary) hypertension: Secondary | ICD-10-CM | POA: Diagnosis not present

## 2016-07-28 DIAGNOSIS — I10 Essential (primary) hypertension: Secondary | ICD-10-CM | POA: Diagnosis not present

## 2016-07-28 DIAGNOSIS — J3089 Other allergic rhinitis: Secondary | ICD-10-CM | POA: Diagnosis not present

## 2016-09-29 DIAGNOSIS — I10 Essential (primary) hypertension: Secondary | ICD-10-CM | POA: Diagnosis not present

## 2016-09-29 DIAGNOSIS — N3941 Urge incontinence: Secondary | ICD-10-CM | POA: Diagnosis not present

## 2016-09-29 DIAGNOSIS — Z0000 Encounter for general adult medical examination without abnormal findings: Secondary | ICD-10-CM | POA: Diagnosis not present

## 2016-11-03 DIAGNOSIS — J3089 Other allergic rhinitis: Secondary | ICD-10-CM | POA: Diagnosis not present

## 2016-11-03 DIAGNOSIS — I10 Essential (primary) hypertension: Secondary | ICD-10-CM | POA: Diagnosis not present

## 2016-11-24 DIAGNOSIS — Z23 Encounter for immunization: Secondary | ICD-10-CM | POA: Diagnosis not present

## 2016-12-23 ENCOUNTER — Other Ambulatory Visit: Payer: Self-pay | Admitting: Obstetrics and Gynecology

## 2016-12-23 DIAGNOSIS — Z1231 Encounter for screening mammogram for malignant neoplasm of breast: Secondary | ICD-10-CM

## 2017-02-03 ENCOUNTER — Ambulatory Visit
Admission: RE | Admit: 2017-02-03 | Discharge: 2017-02-03 | Disposition: A | Discharge status: Home / Self Care | Payer: BLUE CROSS/BLUE SHIELD | Source: Ambulatory Visit | Attending: Obstetrics and Gynecology | Admitting: Obstetrics and Gynecology

## 2017-02-03 DIAGNOSIS — Z1231 Encounter for screening mammogram for malignant neoplasm of breast: Secondary | ICD-10-CM

## 2017-02-10 DIAGNOSIS — I10 Essential (primary) hypertension: Secondary | ICD-10-CM | POA: Diagnosis not present

## 2017-02-10 DIAGNOSIS — J3089 Other allergic rhinitis: Secondary | ICD-10-CM | POA: Diagnosis not present

## 2017-05-16 DIAGNOSIS — I10 Essential (primary) hypertension: Secondary | ICD-10-CM | POA: Diagnosis not present

## 2017-05-16 DIAGNOSIS — J3089 Other allergic rhinitis: Secondary | ICD-10-CM | POA: Diagnosis not present

## 2017-05-23 DIAGNOSIS — Z6829 Body mass index (BMI) 29.0-29.9, adult: Secondary | ICD-10-CM | POA: Diagnosis not present

## 2017-05-23 DIAGNOSIS — Z1151 Encounter for screening for human papillomavirus (HPV): Secondary | ICD-10-CM | POA: Diagnosis not present

## 2017-05-23 DIAGNOSIS — Z01419 Encounter for gynecological examination (general) (routine) without abnormal findings: Secondary | ICD-10-CM | POA: Diagnosis not present

## 2017-07-06 DIAGNOSIS — Z0000 Encounter for general adult medical examination without abnormal findings: Secondary | ICD-10-CM | POA: Diagnosis not present

## 2017-08-12 DIAGNOSIS — I10 Essential (primary) hypertension: Secondary | ICD-10-CM | POA: Diagnosis not present

## 2017-08-12 DIAGNOSIS — J3089 Other allergic rhinitis: Secondary | ICD-10-CM | POA: Diagnosis not present

## 2017-08-22 DIAGNOSIS — H6120 Impacted cerumen, unspecified ear: Secondary | ICD-10-CM | POA: Diagnosis not present

## 2017-10-19 DIAGNOSIS — I10 Essential (primary) hypertension: Secondary | ICD-10-CM | POA: Diagnosis not present

## 2017-10-19 DIAGNOSIS — Z0000 Encounter for general adult medical examination without abnormal findings: Secondary | ICD-10-CM | POA: Diagnosis not present

## 2017-10-19 DIAGNOSIS — R112 Nausea with vomiting, unspecified: Secondary | ICD-10-CM | POA: Diagnosis not present

## 2017-11-21 DIAGNOSIS — R7301 Impaired fasting glucose: Secondary | ICD-10-CM | POA: Diagnosis not present

## 2017-11-23 DIAGNOSIS — I10 Essential (primary) hypertension: Secondary | ICD-10-CM | POA: Diagnosis not present

## 2017-11-23 DIAGNOSIS — Z23 Encounter for immunization: Secondary | ICD-10-CM | POA: Diagnosis not present

## 2017-11-23 DIAGNOSIS — J3089 Other allergic rhinitis: Secondary | ICD-10-CM | POA: Diagnosis not present

## 2017-12-01 DIAGNOSIS — Z713 Dietary counseling and surveillance: Secondary | ICD-10-CM | POA: Diagnosis not present

## 2017-12-15 DIAGNOSIS — Z713 Dietary counseling and surveillance: Secondary | ICD-10-CM | POA: Diagnosis not present

## 2017-12-29 DIAGNOSIS — Z713 Dietary counseling and surveillance: Secondary | ICD-10-CM | POA: Diagnosis not present

## 2018-01-10 DIAGNOSIS — Z713 Dietary counseling and surveillance: Secondary | ICD-10-CM | POA: Diagnosis not present

## 2018-02-13 ENCOUNTER — Other Ambulatory Visit: Payer: Self-pay | Admitting: Obstetrics and Gynecology

## 2018-02-13 DIAGNOSIS — Z1231 Encounter for screening mammogram for malignant neoplasm of breast: Secondary | ICD-10-CM

## 2018-02-14 DIAGNOSIS — Z713 Dietary counseling and surveillance: Secondary | ICD-10-CM | POA: Diagnosis not present

## 2018-03-01 DIAGNOSIS — J3089 Other allergic rhinitis: Secondary | ICD-10-CM | POA: Diagnosis not present

## 2018-03-01 DIAGNOSIS — I10 Essential (primary) hypertension: Secondary | ICD-10-CM | POA: Diagnosis not present

## 2018-03-06 DIAGNOSIS — M5412 Radiculopathy, cervical region: Secondary | ICD-10-CM | POA: Diagnosis not present

## 2018-03-14 ENCOUNTER — Ambulatory Visit
Admission: RE | Admit: 2018-03-14 | Discharge: 2018-03-14 | Disposition: A | Discharge status: Home / Self Care | Payer: BLUE CROSS/BLUE SHIELD | Source: Ambulatory Visit | Attending: Obstetrics and Gynecology | Admitting: Obstetrics and Gynecology

## 2018-03-14 DIAGNOSIS — Z1231 Encounter for screening mammogram for malignant neoplasm of breast: Secondary | ICD-10-CM

## 2018-03-28 DIAGNOSIS — Z1151 Encounter for screening for human papillomavirus (HPV): Secondary | ICD-10-CM | POA: Diagnosis not present

## 2018-03-28 DIAGNOSIS — Z6830 Body mass index (BMI) 30.0-30.9, adult: Secondary | ICD-10-CM | POA: Diagnosis not present

## 2018-03-28 DIAGNOSIS — Z01419 Encounter for gynecological examination (general) (routine) without abnormal findings: Secondary | ICD-10-CM | POA: Diagnosis not present

## 2018-04-03 DIAGNOSIS — M5412 Radiculopathy, cervical region: Secondary | ICD-10-CM | POA: Diagnosis not present

## 2018-05-31 DIAGNOSIS — I10 Essential (primary) hypertension: Secondary | ICD-10-CM | POA: Diagnosis not present

## 2018-05-31 DIAGNOSIS — J3089 Other allergic rhinitis: Secondary | ICD-10-CM | POA: Diagnosis not present

## 2018-07-10 DIAGNOSIS — H6123 Impacted cerumen, bilateral: Secondary | ICD-10-CM | POA: Diagnosis not present

## 2018-07-10 DIAGNOSIS — H6091 Unspecified otitis externa, right ear: Secondary | ICD-10-CM | POA: Diagnosis not present

## 2018-09-25 DIAGNOSIS — I10 Essential (primary) hypertension: Secondary | ICD-10-CM | POA: Diagnosis not present

## 2018-09-25 DIAGNOSIS — Z760 Encounter for issue of repeat prescription: Secondary | ICD-10-CM | POA: Diagnosis not present

## 2018-09-25 DIAGNOSIS — J3089 Other allergic rhinitis: Secondary | ICD-10-CM | POA: Diagnosis not present

## 2018-09-25 DIAGNOSIS — F430 Acute stress reaction: Secondary | ICD-10-CM | POA: Diagnosis not present

## 2018-10-23 ENCOUNTER — Emergency Department (INDEPENDENT_AMBULATORY_CARE_PROVIDER_SITE_OTHER)
Admission: EM | Admit: 2018-10-23 | Discharge: 2018-10-23 | Disposition: A | Discharge status: Home / Self Care | Payer: BC Managed Care – PPO | Source: Home / Self Care

## 2018-10-23 ENCOUNTER — Other Ambulatory Visit: Payer: Self-pay | Admitting: Emergency Medicine

## 2018-10-23 ENCOUNTER — Other Ambulatory Visit: Payer: Self-pay

## 2018-10-23 ENCOUNTER — Telehealth: Payer: Self-pay | Admitting: Emergency Medicine

## 2018-10-23 ENCOUNTER — Emergency Department
Admission: EM | Admit: 2018-10-23 | Discharge: 2018-10-23 | Disposition: A | Discharge status: Home / Self Care | Payer: BC Managed Care – PPO | Source: Home / Self Care

## 2018-10-23 ENCOUNTER — Other Ambulatory Visit (HOSPITAL_BASED_OUTPATIENT_CLINIC_OR_DEPARTMENT_OTHER): Payer: BC Managed Care – PPO

## 2018-10-23 ENCOUNTER — Ambulatory Visit (INDEPENDENT_AMBULATORY_CARE_PROVIDER_SITE_OTHER): Payer: BC Managed Care – PPO

## 2018-10-23 ENCOUNTER — Other Ambulatory Visit: Payer: BC Managed Care – PPO

## 2018-10-23 DIAGNOSIS — R102 Pelvic and perineal pain: Secondary | ICD-10-CM | POA: Diagnosis not present

## 2018-10-23 DIAGNOSIS — R110 Nausea: Secondary | ICD-10-CM

## 2018-10-23 DIAGNOSIS — K5732 Diverticulitis of large intestine without perforation or abscess without bleeding: Secondary | ICD-10-CM

## 2018-10-23 DIAGNOSIS — R1032 Left lower quadrant pain: Secondary | ICD-10-CM | POA: Diagnosis not present

## 2018-10-23 DIAGNOSIS — R109 Unspecified abdominal pain: Secondary | ICD-10-CM | POA: Diagnosis not present

## 2018-10-23 DIAGNOSIS — K5792 Diverticulitis of intestine, part unspecified, without perforation or abscess without bleeding: Secondary | ICD-10-CM

## 2018-10-23 LAB — POCT CBC W AUTO DIFF (K'VILLE URGENT CARE)

## 2018-10-23 LAB — COMPLETE METABOLIC PANEL WITH GFR
AG Ratio: 1.5 (calc) (ref 1.0–2.5)
ALT: 10 U/L (ref 6–29)
AST: 12 U/L (ref 10–35)
Albumin: 4.4 g/dL (ref 3.6–5.1)
Alkaline phosphatase (APISO): 67 U/L (ref 37–153)
BUN: 11 mg/dL (ref 7–25)
CO2: 27 mmol/L (ref 20–32)
Calcium: 9.6 mg/dL (ref 8.6–10.4)
Chloride: 102 mmol/L (ref 98–110)
Creat: 0.88 mg/dL (ref 0.50–1.05)
GFR, Est African American: 83 mL/min/{1.73_m2} (ref >=60)
GFR, Est Non African American: 72 mL/min/{1.73_m2} (ref >=60)
Globulin: 3 g/dL (calc) (ref 1.9–3.7)
Glucose, Bld: 126 mg/dL — ABNORMAL HIGH (ref 65–99)
Potassium: 4 mmol/L (ref 3.5–5.3)
Sodium: 140 mmol/L (ref 135–146)
Total Bilirubin: 0.9 mg/dL (ref 0.2–1.2)
Total Protein: 7.4 g/dL (ref 6.1–8.1)

## 2018-10-23 LAB — POCT URINALYSIS DIP (MANUAL ENTRY)
Glucose, UA: NEGATIVE mg/dL
Leukocytes, UA: NEGATIVE
Nitrite, UA: NEGATIVE
Protein Ur, POC: 100 mg/dL — AB
Spec Grav, UA: 1.025 (ref 1.010–1.025)
Urobilinogen, UA: 1 E.U./dL
pH, UA: 6 (ref 5.0–8.0)

## 2018-10-23 MED ORDER — CIPROFLOXACIN HCL 500 MG PO TABS: 500.0000 mg | ORAL_TABLET | Freq: Two times a day (BID) | ORAL | 0 refills | Status: AC

## 2018-10-23 MED ORDER — ONDANSETRON 4 MG PO TBDP
4.0000 mg | ORAL_TABLET | Freq: Once | ORAL | Status: AC
  Administered 2018-10-23: 4 mg via ORAL

## 2018-10-23 MED ORDER — IOPAMIDOL (ISOVUE-300) INJECTION 61%
100.0000 mL | Freq: Once | INTRAVENOUS | Status: AC | PRN
  Administered 2018-10-23: 17:00:00 100 mL via INTRAVENOUS

## 2018-10-23 MED ORDER — METRONIDAZOLE 500 MG PO TABS: 500.0000 mg | ORAL_TABLET | Freq: Three times a day (TID) | ORAL | 0 refills | Status: AC

## 2018-10-23 NOTE — ED Triage Notes (Signed)
Abd pain started Friday last week and has been localized in the lower abd and LLQ.  Has taken a laxative and tums, and pepto.  Nothing has helped.  Denies fever, diarrhea, vomiting.  Slight nausea.

## 2018-10-23 NOTE — Discharge Instructions (Signed)
°  Your symptoms are most consistent with diverticulitis, an infection in your intestines.  To confirm the diagnosis and severity, it is recommended you have a CT scan of your abdomen performed today.  Please drink the contrast as discussed.  You will be notified prior to 3pm if you need to return to this urgent care [facility]) or if you need to go to our sister site, [facility] for the imaging to be completed.  If you develop worsening symptoms between now and then, please go directly to [facility], which is a stand alone emergency department and will get you in right away.

## 2018-10-23 NOTE — Telephone Encounter (Signed)
Note made to add CT order that fell off from earlier visit this morning.

## 2018-10-23 NOTE — ED Provider Notes (Signed)
[facility]    CSN: [id number] Arrival date & time: 10/23/18  0859      History   Chief Complaint Chief Complaint  Patient presents with   Abdominal Pain    HPI SHAWNIECE OYOLA is a 58 y.o. female.   HPI  JURNEI LATINI is a 58 y.o. female presenting to UC with c/o lower abdominal pain that started about 3 days ago, worsening into LLQ.  Mild nausea but no vomiting.  She has taken an OTC laxative, tums and pepto w/o relief. Last normal BM was 3 days ago but states she has not really eaten much so she is not too concerned about being constipated. Denies fever, chills, vomiting or diarrhea. No hx of diverticulitis in the past but she did have a colonoscopy 9 years ago, due back next year, and was advised she did have diverticula they would monitor.    Past Medical History:  Diagnosis Date   Hypertension     There are no active problems to display for this patient.   Past Surgical History:  Procedure Laterality Date   APPENDECTOMY     MYOMECTOMY      OB History   No obstetric history on file.      Home Medications    Prior to Admission medications   Medication Sig Start Date End Date Taking? Authorizing Provider  amLODipine-atorvastatin (CADUET) 5-10 MG tablet Take 1 tablet by mouth daily.   Yes [provider]  ciprofloxacin (CIPRO) 500 MG tablet Take 1 tablet (500 mg total) by mouth 2 (two) times daily. One po bid x 7 days 10/23/18   Noe Gens, PA-C  gabapentin (NEURONTIN) 100 MG capsule Take 100 mg by mouth 3 (three) times daily.    [provider]  losartan (COZAAR) 25 MG tablet Take 25 mg by mouth daily.    [provider]  meloxicam (MOBIC) 15 MG tablet Take 15 mg by mouth daily.    [provider]  metroNIDAZOLE (FLAGYL) 500 MG tablet Take 1 tablet (500 mg total) by mouth 3 (three) times daily for 7 days. One po bid x 7 days 10/23/18 10/30/18  Noe Gens, PA-C  ondansetron (ZOFRAN-ODT) 4 MG  disintegrating tablet Take 1 tablet (4 mg total) by mouth every 8 (eight) hours as needed for nausea or vomiting. 01/04/16   Jacqulyn Cane, MD    Family History Family History  Problem Relation Age of Onset   Cancer Mother    Breast cancer Mother    Heart failure Sister    Breast cancer Sister     Social History Social History   Tobacco Use   Smoking status: Never Smoker   Smokeless tobacco: Never Used  Substance Use Topics   Alcohol use: No   Drug use: Not on file     Allergies   Patient has no known allergies.   Review of Systems Review of Systems  Constitutional: Positive for appetite change (decreased). Negative for chills, diaphoresis, fatigue, fever and unexpected weight change.  Gastrointestinal: Positive for abdominal pain and nausea ( minimal). Negative for blood in stool, constipation, diarrhea and vomiting.  Genitourinary: Negative for dysuria, flank pain, frequency and hematuria.  Musculoskeletal: Negative for back pain.     Physical Exam Triage Vital Signs ED Triage Vitals  Enc Vitals Group     BP 10/23/18 0923 113/69     Pulse Rate 10/23/18 0923 (!) 122     Resp 10/23/18 0923 20  Temp 10/23/18 0923 98.7 F (37.1 C)     Temp Source 10/23/18 0923 Oral     SpO2 10/23/18 0923 98 %     Weight 10/23/18 0925 171 lb (77.6 kg)     Height 10/23/18 0925 5\' 4"  (1.626 m)     Head Circumference --      Peak Flow --      Pain Score 10/23/18 0924 8     Pain Loc --      Pain Edu? --      Excl. in GC? --    No data found.  Updated Vital Signs BP 113/69 (BP Location: Right Arm)    Pulse (!) 122    Temp 98.7 F (37.1 C) (Oral)    Resp 20    Ht 5\' 4"  (1.626 m)    Wt 171 lb (77.6 kg)    SpO2 98%    BMI 29.35 kg/m   Visual Acuity Right Eye Distance:   Left Eye Distance:   Bilateral Distance:    Right Eye Near:   Left Eye Near:    Bilateral Near:     Physical Exam Vitals signs and nursing note reviewed.  Constitutional:      General: She is  not in acute distress.    Appearance: She is well-developed. She is not ill-appearing, toxic-appearing or diaphoretic.  HENT:     Head: Normocephalic and atraumatic.  Neck:     Musculoskeletal: Normal range of motion.  Cardiovascular:     Rate and Rhythm: Regular rhythm. Tachycardia present.     Comments: Mild tachycardia Pulmonary:     Effort: Pulmonary effort is normal. No respiratory distress.     Breath sounds: Normal breath sounds.  Abdominal:     Tenderness: There is abdominal tenderness in the suprapubic area and left lower quadrant. There is no right CVA tenderness or left CVA tenderness.  Musculoskeletal: Normal range of motion.  Skin:    General: Skin is warm and dry.  Neurological:     Mental Status: She is alert and oriented to person, place, and time.  Psychiatric:        Behavior: Behavior normal.      UC Treatments / Results  Labs (all labs ordered are listed, but only abnormal results are displayed) Labs Reviewed  COMPLETE METABOLIC PANEL WITH GFR - Abnormal; Notable for the following components:      Result Value   Glucose, Bld 126 (*)    All other components within normal limits  POCT URINALYSIS DIP (MANUAL ENTRY) - Abnormal; Notable for the following components:   Color, UA other (*)    Bilirubin, UA moderate (*)    Ketones, POC UA small (15) (*)    Blood, UA moderate (*)    Protein Ur, POC =100 (*)    All other components within normal limits  URINE CULTURE  POCT CBC W AUTO DIFF ([facility])    EKG   Radiology Ct Abdomen Pelvis W Contrast  Result Date: 10/23/2018 CLINICAL DATA:  Abdominal pain in the left lower quadrant for 4 days EXAM: CT ABDOMEN AND PELVIS WITH CONTRAST TECHNIQUE: Multidetector CT imaging of the abdomen and pelvis was performed using the standard protocol following bolus administration of intravenous contrast. CONTRAST:  ISOVUE-300 IOPAMIDOL (ISOVUE-300) INJECTION 61% COMPARISON:  CT abdomen and pelvis dated  04/21/2010 FINDINGS: Lower chest: A 5 mm solid pulmonary nodule seen in the right lower lobe (series 3, image 19). Hepatobiliary: No focal liver abnormality is  seen. No gallstones, gallbladder wall thickening, or biliary dilatation. Pancreas: Unremarkable. No pancreatic ductal dilatation or surrounding inflammatory changes. Spleen: Normal in size without focal abnormality. Adrenals/Urinary Tract: Adrenal glands are unremarkable. A 1.4 cm cyst is seen in the left kidney. A few right renal calculi measuring up to 2 mm are noted. There is no hydronephrosis on either side. Bladder is unremarkable. Stomach/Bowel: There is bowel wall thickening with surrounding fat stranding and edema in the distal descending colon consistent with acute diverticulitis. There is no evidence of abscess or fistula formation. There is no free intraperitoneal fluid or air. Stomach is within normal limits. The appendix is surgically absent. Enteric contrast extends to the rectum. Vascular/Lymphatic: No significant vascular findings are present. No enlarged abdominal or pelvic lymph nodes. Reproductive: Calcified fibroid is seen in the uterus. Other: No abdominal wall hernia or abnormality. Musculoskeletal: No acute or significant osseous findings. IMPRESSION: 1. Acute diverticulitis involving the distal descending colon without evidence of complication. 2. Solid pulmonary nodule in the right lower lobe measuring 5 mm. No follow-up needed if patient is low-risk. Non-contrast chest CT can be considered in 12 months if patient is high-risk. This recommendation follows the consensus statement: Guidelines for Management of Incidental Pulmonary Nodules Detected on CT Images: From the Fleischner Society 2017; Radiology 2017; 284:228-243. These results will be called to the ordering clinician or representative by the Radiologist Assistant, and communication documented in the PACS or zVision Dashboard. Electronically Signed   By: Romona Curlsyler  Litton M.D.    On: 10/23/2018 16:53    Procedures Procedures (including critical care time)  Medications Ordered in UC Medications  ondansetron (ZOFRAN-ODT) disintegrating tablet 4 mg (4 mg Oral Given 10/23/18 1006)    Initial Impression / Assessment and Plan / UC Course  I have reviewed the triage vital signs and the nursing notes.  Pertinent labs & imaging results that were available during my care of the patient were reviewed by me and considered in my medical decision making (see chart for details).     There was some delay from patient's initial [facility] around 9:30AM and CT scan due to CT machine being out of service and pending stat labs. CT performed around 4PM.  Reviewed imaging with pt Will start pt on Cipro and Flagyl She declined zofran AVS provided  Discussed symptoms that warrant emergent care in the ED.  Final Clinical Impressions(s) / UC Diagnoses   Final diagnoses:  LLQ abdominal pain  Abdominal pain, suprapubic  Nausea without vomiting     Discharge Instructions      Your symptoms are most consistent with diverticulitis, an infection in your intestines.  To confirm the diagnosis and severity, it is recommended you have a CT scan of your abdomen performed today.  Please drink the contrast as discussed.  You will be notified prior to 3pm if you need to return to this urgent care [facility]) or if you need to go [facility] for the imaging to be completed.  If you develop worsening symptoms between now and then, please go directly to [facility], which is a stand alone emergency department and will get you in right away.     ED Prescriptions    None     Controlled Substance Prescriptions [place] Controlled Substance Registry consulted? Not Applicable   Rolla Platehelps, Jamespaul Secrist O, PA-C 10/23/18 [id number]

## 2018-10-23 NOTE — ED Provider Notes (Addendum)
[facility]    CSN: [id number] Arrival date & time: 10/23/18  1543      History   Chief Complaint No chief complaint on file.   HPI Elizabeth Cochran is a 58 y.o. female.   HPI  Elizabeth Cochran is a 58 y.o. female presenting to UC with c/o lower abdominal pain that started about 3 days ago, worsening into LLQ.  Mild nausea but no vomiting.  She has taken an OTC laxative, tums and pepto w/o relief. Last normal BM was 3 days ago but states she has not really eaten much so she is not too concerned about being constipated. Denies fever, chills, vomiting or diarrhea. No hx of diverticulitis in the past but she did have a colonoscopy 9 years ago, due back next year, and was advised she did have diverticula they would monitor.    Past Medical History:  Diagnosis Date   Hypertension     There are no active problems to display for this patient.   Past Surgical History:  Procedure Laterality Date   APPENDECTOMY     MYOMECTOMY      OB History   No obstetric history on file.      Home Medications    Prior to Admission medications   Medication Sig Start Date End Date Taking? Authorizing Provider  amLODipine-atorvastatin (CADUET) 5-10 MG tablet Take 1 tablet by mouth daily.    [provider]  ciprofloxacin (CIPRO) 500 MG tablet Take 1 tablet (500 mg total) by mouth 2 (two) times daily. One po bid x 7 days 10/23/18   Lurene ShadowPhelps, [place] O, PA-C  gabapentin (NEURONTIN) 100 MG capsule Take 100 mg by mouth 3 (three) times daily.    [provider]  losartan (COZAAR) 25 MG tablet Take 25 mg by mouth daily.    [provider]  meloxicam (MOBIC) 15 MG tablet Take 15 mg by mouth daily.    [provider]  metroNIDAZOLE (FLAGYL) 500 MG tablet Take 1 tablet (500 mg total) by mouth 3 (three) times daily for 7 days. One po bid x 7 days 10/23/18 10/30/18  Lurene ShadowPhelps, [place] O, PA-C  ondansetron (ZOFRAN-ODT) 4 MG disintegrating tablet Take 1 tablet (4 mg  total) by mouth every 8 (eight) hours as needed for nausea or vomiting. 01/04/16   Lajean ManesMassey, David, MD    Family History Family History  Problem Relation Age of Onset   Cancer Mother    Breast cancer Mother    Heart failure Sister    Breast cancer Sister     Social History Social History   Tobacco Use   Smoking status: Never Smoker   Smokeless tobacco: Never Used  Substance Use Topics   Alcohol use: No   Drug use: Not on file     Allergies   Patient has no known allergies.   Review of Systems Review of Systems  Constitutional: Positive for appetite change (decreased). Negative for chills, fatigue, fever and unexpected weight change.  Gastrointestinal: Positive for abdominal pain. Negative for blood in stool, constipation, diarrhea, nausea and vomiting.  Genitourinary: Negative for dysuria, flank pain, frequency and hematuria.  Musculoskeletal: Negative for back pain.     Physical Exam Triage Vital Signs ED Triage Vitals [10/23/18 1725]  Enc Vitals Group     BP      Pulse      Resp      Temp      Temp src      SpO2  Weight      Height      Head Circumference      Peak Flow      Pain Score 3     Pain Loc      Pain Edu?      Excl. in GC?    No data found.  Updated Vital Signs There were no vitals taken for this visit.  Visual Acuity Right Eye Distance:   Left Eye Distance:   Bilateral Distance:    Right Eye Near:   Left Eye Near:    Bilateral Near:     Physical Exam Vitals signs and nursing note reviewed.  Constitutional:      Appearance: Normal appearance. She is well-developed.  HENT:     Head: Normocephalic and atraumatic.     Mouth/Throat:     Mouth: Mucous membranes are moist.  Neck:     Musculoskeletal: Normal range of motion.  Cardiovascular:     Rate and Rhythm: Regular rhythm. Tachycardia present.     Comments: Mild tachycardia, regular rate and rhythm Pulmonary:     Effort: Pulmonary effort is normal. No respiratory  distress.     Breath sounds: Normal breath sounds.  Abdominal:     General: There is no distension.     Palpations: Abdomen is soft.     Tenderness: There is abdominal tenderness in the suprapubic area and left lower quadrant. There is no right CVA tenderness, left CVA tenderness, guarding or rebound.  Musculoskeletal: Normal range of motion.  Skin:    General: Skin is warm and dry.  Neurological:     Mental Status: She is alert and oriented to person, place, and time.  Psychiatric:        Behavior: Behavior normal.      UC Treatments / Results  Labs (all labs ordered are listed, but only abnormal results are displayed) Labs Reviewed - No data to display  EKG   Radiology Ct Abdomen Pelvis W Contrast  Result Date: 10/23/2018 CLINICAL DATA:  Abdominal pain in the left lower quadrant for 4 days EXAM: CT ABDOMEN AND PELVIS WITH CONTRAST TECHNIQUE: Multidetector CT imaging of the abdomen and pelvis was performed using the standard protocol following bolus administration of intravenous contrast. CONTRAST:  ISOVUE-300 IOPAMIDOL (ISOVUE-300) INJECTION 61% COMPARISON:  CT abdomen and pelvis dated 04/21/2010 FINDINGS: Lower chest: A 5 mm solid pulmonary nodule seen in the right lower lobe (series 3, image 19). Hepatobiliary: No focal liver abnormality is seen. No gallstones, gallbladder wall thickening, or biliary dilatation. Pancreas: Unremarkable. No pancreatic ductal dilatation or surrounding inflammatory changes. Spleen: Normal in size without focal abnormality. Adrenals/Urinary Tract: Adrenal glands are unremarkable. A 1.4 cm cyst is seen in the left kidney. A few right renal calculi measuring up to 2 mm are noted. There is no hydronephrosis on either side. Bladder is unremarkable. Stomach/Bowel: There is bowel wall thickening with surrounding fat stranding and edema in the distal descending colon consistent with acute diverticulitis. There is no evidence of abscess or fistula formation.  There is no free intraperitoneal fluid or air. Stomach is within normal limits. The appendix is surgically absent. Enteric contrast extends to the rectum. Vascular/Lymphatic: No significant vascular findings are present. No enlarged abdominal or pelvic lymph nodes. Reproductive: Calcified fibroid is seen in the uterus. Other: No abdominal wall hernia or abnormality. Musculoskeletal: No acute or significant osseous findings. IMPRESSION: 1. Acute diverticulitis involving the distal descending colon without evidence of complication. 2. Solid pulmonary nodule in the  right lower lobe measuring 5 mm. No follow-up needed if patient is low-risk. Non-contrast chest CT can be considered in 12 months if patient is high-risk. This recommendation follows the consensus statement: Guidelines for Management of Incidental Pulmonary Nodules Detected on CT Images: From the Fleischner Society 2017; Radiology 2017; 284:228-243. These results will be called to the ordering clinician or representative by the Radiologist Assistant, and communication documented in the PACS or zVision Dashboard. Electronically Signed   By: Zerita Boers M.D.   On: 10/23/2018 16:53    Procedures Procedures (including critical care time)  Medications Ordered in UC Medications - No data to display  Initial Impression / Assessment and Plan / UC Course  I have reviewed the triage vital signs and the nursing notes.  Pertinent labs & imaging results that were available during my care of the patient were reviewed by me and considered in my medical decision making (see chart for details).     There was some delay from patient's initial presentation to [facility] around 9:30AM and CT scan due to CT machine being out of service and pending stat labs. CT performed around 4PM.  Reviewed imaging with pt Will start pt on Cipro and Flagyl She declined zofran AVS provided  Discussed symptoms that warrant emergent care in the ED.  *NO CHARGE. THIS WAS  CONTINUATION OF VISIT EARLIER TODAY AROUND 9:30AM. CHARGE WAS PLACED ON THAT VISIT/ENCOUNTER NOTE.   Final Clinical Impressions(s) / UC Diagnoses   Final diagnoses:  Diverticulitis of colon without hemorrhage   Discharge Instructions   None    ED Prescriptions    Medication Sig Dispense Auth. Provider   metroNIDAZOLE (FLAGYL) 500 MG tablet Take 1 tablet (500 mg total) by mouth 3 (three) times daily for 7 days. One po bid x 7 days 21 tablet Gerarda Fraction, Avina Eberle O, PA-C   ciprofloxacin (CIPRO) 500 MG tablet Take 1 tablet (500 mg total) by mouth 2 (two) times daily. One po bid x 7 days 14 tablet Noe Gens, [place]     Controlled Substance Prescriptions [place] Controlled Substance Registry consulted? Not Applicable     Tyrell Antonio 10/23/18 Lehr, PA-C 10/23/18 1810

## 2018-10-23 NOTE — Telephone Encounter (Signed)
Flagyl 1 tid #21

## 2018-10-25 LAB — URINE CULTURE
MICRO NUMBER:: 877420
Result:: NO GROWTH
SPECIMEN QUALITY:: ADEQUATE

## 2018-10-31 DIAGNOSIS — K5730 Diverticulosis of large intestine without perforation or abscess without bleeding: Secondary | ICD-10-CM | POA: Diagnosis not present

## 2018-10-31 DIAGNOSIS — K219 Gastro-esophageal reflux disease without esophagitis: Secondary | ICD-10-CM | POA: Diagnosis not present

## 2018-11-24 DIAGNOSIS — R7303 Prediabetes: Secondary | ICD-10-CM | POA: Diagnosis not present

## 2018-11-24 DIAGNOSIS — I10 Essential (primary) hypertension: Secondary | ICD-10-CM | POA: Diagnosis not present

## 2018-11-28 DIAGNOSIS — Z0000 Encounter for general adult medical examination without abnormal findings: Secondary | ICD-10-CM | POA: Diagnosis not present

## 2018-11-29 ENCOUNTER — Other Ambulatory Visit: Payer: Self-pay

## 2018-11-29 ENCOUNTER — Emergency Department
Admission: EM | Admit: 2018-11-29 | Discharge: 2018-11-29 | Disposition: A | Discharge status: Home / Self Care | Payer: BC Managed Care – PPO | Source: Home / Self Care

## 2018-11-29 ENCOUNTER — Encounter: Payer: Self-pay | Admitting: Emergency Medicine

## 2018-11-29 DIAGNOSIS — M79622 Pain in left upper arm: Secondary | ICD-10-CM

## 2018-11-29 DIAGNOSIS — M549 Dorsalgia, unspecified: Secondary | ICD-10-CM

## 2018-11-29 MED ORDER — METHOCARBAMOL 500 MG PO TABS: 500.0000 mg | ORAL_TABLET | Freq: Two times a day (BID) | ORAL | 0 refills | Status: AC

## 2018-11-29 MED ORDER — TRAMADOL HCL 50 MG PO TABS: 50.0000 mg | ORAL_TABLET | Freq: Four times a day (QID) | ORAL | 0 refills | Status: AC | PRN

## 2018-11-29 NOTE — ED Provider Notes (Signed)
[facility]    CSN: [id number] Arrival date & time: 11/29/18  1711      History   Chief Complaint Chief Complaint  Patient presents with  . Shoulder Pain  . Arm Pain    HPI Elizabeth Cochran is a 58 y.o. female.   HPI Elizabeth Cochran is a 58 y.o. female presenting to UC with c/o 1 week of gradually worsening Left shoulder and upper arm pain. Pain started when she woke one morning, she initially thought she had slept on it wrong.  She has had slight decreased ROM due to the pain.  Hx of a herniated cervical disc dx 3 years ago.  She started her previously prescribed gabapentin and melixam last week with minimal relief.  Occasional tinging in her Left hand but none at this time. Denies weakness to her grip.  No known injury, no heavy lifting or repetitive movements.  Pain is aching and sore, 7/10.     Past Medical History:  Diagnosis Date  . Hypertension     There are no active problems to display for this patient.   Past Surgical History:  Procedure Laterality Date  . APPENDECTOMY    . MYOMECTOMY      OB History   No obstetric history on file.      Home Medications    Prior to Admission medications   Medication Sig Start Date End Date Taking? Authorizing Provider  cetirizine (ZYRTEC) 10 MG tablet Take 10 mg by mouth daily.   Yes [provider]  amLODipine-atorvastatin (CADUET) 5-10 MG tablet Take 1 tablet by mouth daily.    [provider]  ciprofloxacin (CIPRO) 500 MG tablet Take 1 tablet (500 mg total) by mouth 2 (two) times daily. One po bid x 7 days 10/23/18   Lurene ShadowPhelps, Martel Galvan O, PA-C  gabapentin (NEURONTIN) 100 MG capsule Take 100 mg by mouth 3 (three) times daily.    [provider]  losartan (COZAAR) 25 MG tablet Take 25 mg by mouth daily.    [provider]  meloxicam (MOBIC) 15 MG tablet Take 15 mg by mouth daily.    [provider]  methocarbamol (ROBAXIN) 500 MG tablet Take 1 tablet (500 mg total) by  mouth 2 (two) times daily. 11/29/18   Lurene ShadowPhelps, Hennessy Bartel O, PA-C  ondansetron (ZOFRAN-ODT) 4 MG disintegrating tablet Take 1 tablet (4 mg total) by mouth every 8 (eight) hours as needed for nausea or vomiting. 01/04/16   Lajean ManesMassey, David, MD  traMADol (ULTRAM) 50 MG tablet Take 1 tablet (50 mg total) by mouth every 6 (six) hours as needed. 11/29/18   Lurene ShadowPhelps, Arling Cerone O, PA-C    Family History Family History  Problem Relation Age of Onset  . Cancer Mother   . Breast cancer Mother   . Heart failure Sister   . Breast cancer Sister     Social History Social History   Tobacco Use  . Smoking status: Never Smoker  . Smokeless tobacco: Never Used  Substance Use Topics  . Alcohol use: No  . Drug use: Not on file     Allergies   Vicodin [hydrocodone-acetaminophen]   Review of Systems Review of Systems  Musculoskeletal: Positive for arthralgias and myalgias. Negative for neck pain and neck stiffness.  Neurological: Positive for numbness (mild Left hand tingling at times). Negative for weakness.     Physical Exam Triage Vital Signs ED Triage Vitals  Enc Vitals Group     BP 11/29/18 1728 118/78  Pulse Rate 11/29/18 1728 86     Resp 11/29/18 1728 (!) 6     Temp 11/29/18 1728 98.5 F (36.9 C)     Temp Source 11/29/18 1728 Oral     SpO2 11/29/18 1728 99 %     Weight 11/29/18 1734 170 lb (77.1 kg)     Height 11/29/18 1734 5\' 4"  (1.626 m)     Head Circumference --      Peak Flow --      Pain Score 11/29/18 1734 7     Pain Loc --      Pain Edu? --      Excl. in [place]? --    No data found.  Updated Vital Signs BP 118/78 (BP Location: Right Arm)   Pulse 86   Temp 98.5 F (36.9 C) (Oral)   Resp 16   Ht 5\' 4"  (1.626 m)   Wt 170 lb (77.1 kg)   SpO2 99%   BMI 29.18 kg/m   Visual Acuity Right Eye Distance:   Left Eye Distance:   Bilateral Distance:    Right Eye Near:   Left Eye Near:    Bilateral Near:     Physical Exam Vitals signs and nursing note reviewed.   Constitutional:      Appearance: Normal appearance. She is well-developed.  HENT:     Head: Normocephalic and atraumatic.  Neck:     Musculoskeletal: Normal range of motion and neck supple. Normal range of motion. Muscular tenderness ( mild, Left side) present. No neck rigidity or spinous process tenderness.  Cardiovascular:     Rate and Rhythm: Normal rate.  Pulmonary:     Effort: Pulmonary effort is normal.  Musculoskeletal: Normal range of motion.        General: Tenderness present. No swelling.     Comments: No spinal tenderness. Mild tenderness to Left side cervical muscles and upper trapezius. Mild tenderness to Left side rhomboid muscles. No bony tenderness. Full ROM Left arm with increased pain on full abduction.  5/5 strength in Left arm. Full ROM Left elbow, non-tender  Skin:    General: Skin is warm and dry.     Capillary Refill: Capillary refill takes less than 2 seconds.     Findings: No bruising or erythema.  Neurological:     Mental Status: She is alert and oriented to person, place, and time.     Sensory: No sensory deficit.  Psychiatric:        Behavior: Behavior normal.      UC Treatments / Results  Labs (all labs ordered are listed, but only abnormal results are displayed) Labs Reviewed - No data to display  EKG   Radiology No results found.  Procedures Procedures (including critical care time)  Medications Ordered in UC Medications - No data to display  Initial Impression / Assessment and Plan / UC Course  I have reviewed the triage vital signs and the nursing notes.  Pertinent labs & imaging results that were available during my care of the patient were reviewed by me and considered in my medical decision making (see chart for details).     Hx and exam most c/w muscle strain/spasms. Will tx conservatively at this time Encouraged f/u with her orthopedist or with sports medicine next week if not improving, may need imaging or different  treatment Pt understanding and agreeable with tx plan  AVS provided  Final Clinical Impressions(s) / UC Diagnoses   Final diagnoses:  Left upper arm pain  Upper back pain on left side     Discharge Instructions      You may continue to take your Meloxicam (Mobic) is an antiinflammatory to help with pain and inflammation.  Do not take ibuprofen, Advil, Aleve, or any other medications that contain NSAIDs while taking meloxicam as this may cause stomach upset or even ulcers if taken in large amounts for an extended period of time.   You may also take Tylenol (acetaminophen) 500mg  every 4-6 hours as needed for pain during the day.  Tramadol is strong pain medication. While taking, do not drink alcohol, drive, or perform any other activities that requires focus while taking these medications.   Robaxin (methocarbamol) is a muscle relaxer and may cause drowsiness. Do not drink alcohol, drive, or operate heavy machinery while taking.  Call to schedule a follow up appointment with your orthopedist if not improving next week, or you may try to follow up with Sports Medicine at [facility] if unable to get into your orthopedist in a timely manner.      ED Prescriptions    Medication Sig Dispense Auth. Provider   traMADol (ULTRAM) 50 MG tablet Take 1 tablet (50 mg total) by mouth every 6 (six) hours as needed. 15 tablet [facility], Ardys Hataway O, PA-C   methocarbamol (ROBAXIN) 500 MG tablet Take 1 tablet (500 mg total) by mouth 2 (two) times daily. 20 tablet Doroteo Glassman, Lurene Shadow     I have reviewed the PDMP during this encounter.   [place], Lurene Shadow 11/29/18 1924

## 2018-11-29 NOTE — ED Triage Notes (Signed)
Woke up 6 days ago with soreness in left shoulder that radiates to left upper arm/elbow; denies chest pain, shortness of breath. Thought it might be due to herniated cervical disc which was diagnosed 3 years ago; she has been taking gabapentin and meloxicam which have not helped.  Has not travelled outside [place] past 4 weeks.

## 2018-11-29 NOTE — Discharge Instructions (Addendum)
°  You may continue to take your Meloxicam (Mobic) is an antiinflammatory to help with pain and inflammation.  Do not take ibuprofen, Advil, Aleve, or any other medications that contain NSAIDs while taking meloxicam as this may cause stomach upset or even ulcers if taken in large amounts for an extended period of time.   You may also take Tylenol (acetaminophen) 500mg  every 4-6 hours as needed for pain during the day.  Tramadol is strong pain medication. While taking, do not drink alcohol, drive, or perform any other activities that requires focus while taking these medications.   Robaxin (methocarbamol) is a muscle relaxer and may cause drowsiness. Do not drink alcohol, drive, or operate heavy machinery while taking.  Call to schedule a follow up appointment with your orthopedist if not improving next week, or you may try to follow up with Sports Medicine at [facility] if unable to get into your orthopedist in a timely manner.

## 2018-12-19 DIAGNOSIS — I10 Essential (primary) hypertension: Secondary | ICD-10-CM | POA: Diagnosis not present

## 2018-12-19 DIAGNOSIS — J3089 Other allergic rhinitis: Secondary | ICD-10-CM | POA: Diagnosis not present

## 2018-12-19 DIAGNOSIS — Z760 Encounter for issue of repeat prescription: Secondary | ICD-10-CM | POA: Diagnosis not present

## 2019-01-17 DIAGNOSIS — R630 Anorexia: Secondary | ICD-10-CM | POA: Diagnosis not present

## 2019-01-17 DIAGNOSIS — R6883 Chills (without fever): Secondary | ICD-10-CM | POA: Diagnosis not present

## 2019-01-17 DIAGNOSIS — Z20828 Contact with and (suspected) exposure to other viral communicable diseases: Secondary | ICD-10-CM | POA: Diagnosis not present

## 2019-01-17 DIAGNOSIS — R0981 Nasal congestion: Secondary | ICD-10-CM | POA: Diagnosis not present

## 2019-01-22 DIAGNOSIS — H6123 Impacted cerumen, bilateral: Secondary | ICD-10-CM | POA: Diagnosis not present

## 2019-02-12 ENCOUNTER — Other Ambulatory Visit: Payer: Self-pay | Admitting: Obstetrics and Gynecology

## 2019-02-12 DIAGNOSIS — Z1231 Encounter for screening mammogram for malignant neoplasm of breast: Secondary | ICD-10-CM

## 2019-03-01 DIAGNOSIS — R634 Abnormal weight loss: Secondary | ICD-10-CM | POA: Diagnosis not present

## 2019-03-01 DIAGNOSIS — R110 Nausea: Secondary | ICD-10-CM | POA: Diagnosis not present

## 2019-03-01 DIAGNOSIS — K5730 Diverticulosis of large intestine without perforation or abscess without bleeding: Secondary | ICD-10-CM | POA: Diagnosis not present

## 2019-03-01 DIAGNOSIS — K219 Gastro-esophageal reflux disease without esophagitis: Secondary | ICD-10-CM | POA: Diagnosis not present

## 2019-03-12 DIAGNOSIS — R634 Abnormal weight loss: Secondary | ICD-10-CM | POA: Diagnosis not present

## 2019-03-12 DIAGNOSIS — R110 Nausea: Secondary | ICD-10-CM | POA: Diagnosis not present

## 2019-03-23 ENCOUNTER — Other Ambulatory Visit: Payer: Self-pay

## 2019-03-23 ENCOUNTER — Ambulatory Visit
Admission: RE | Admit: 2019-03-23 | Discharge: 2019-03-23 | Disposition: A | Discharge status: Home / Self Care | Payer: BC Managed Care – PPO | Source: Ambulatory Visit | Attending: Obstetrics and Gynecology | Admitting: Obstetrics and Gynecology

## 2019-03-23 DIAGNOSIS — Z1231 Encounter for screening mammogram for malignant neoplasm of breast: Secondary | ICD-10-CM | POA: Diagnosis not present

## 2019-04-04 DIAGNOSIS — K5732 Diverticulitis of large intestine without perforation or abscess without bleeding: Secondary | ICD-10-CM | POA: Diagnosis not present

## 2019-04-04 DIAGNOSIS — K5730 Diverticulosis of large intestine without perforation or abscess without bleeding: Secondary | ICD-10-CM | POA: Diagnosis not present

## 2019-04-04 DIAGNOSIS — R110 Nausea: Secondary | ICD-10-CM | POA: Diagnosis not present

## 2019-04-05 DIAGNOSIS — Z6826 Body mass index (BMI) 26.0-26.9, adult: Secondary | ICD-10-CM | POA: Diagnosis not present

## 2019-04-05 DIAGNOSIS — Z01419 Encounter for gynecological examination (general) (routine) without abnormal findings: Secondary | ICD-10-CM | POA: Diagnosis not present

## 2019-04-05 DIAGNOSIS — Z1151 Encounter for screening for human papillomavirus (HPV): Secondary | ICD-10-CM | POA: Diagnosis not present

## 2019-04-06 DIAGNOSIS — Z23 Encounter for immunization: Secondary | ICD-10-CM | POA: Diagnosis not present

## 2019-05-04 DIAGNOSIS — Z23 Encounter for immunization: Secondary | ICD-10-CM | POA: Diagnosis not present

## 2019-05-30 IMAGING — MG DIGITAL SCREENING BILATERAL MAMMOGRAM WITH TOMO AND CAD
8 series · 9 of 24 positions shown · non-contrast
Comparison: Previous exam(s).

CLINICAL DATA: Screening.

EXAM:
DIGITAL SCREENING BILATERAL MAMMOGRAM WITH TOMO AND CAD

[R CC synth-2D]
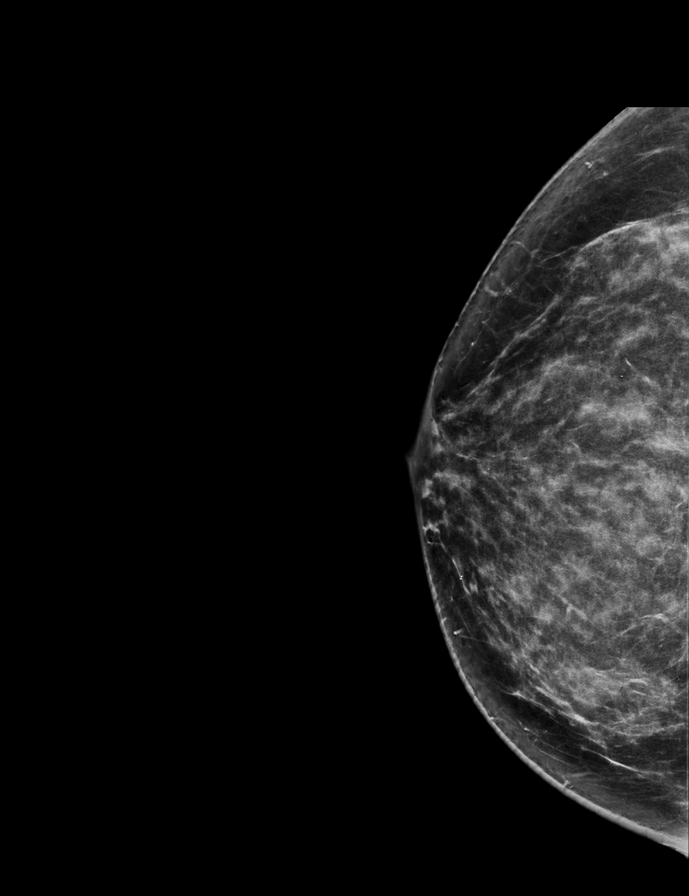

[R MLO synth-2D]
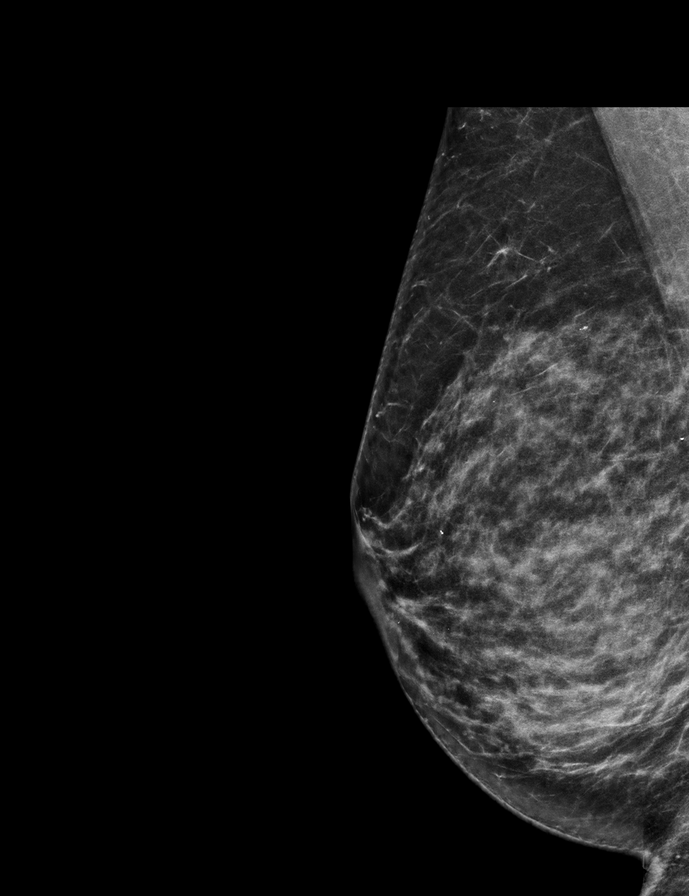

[L MLO synth-2D]
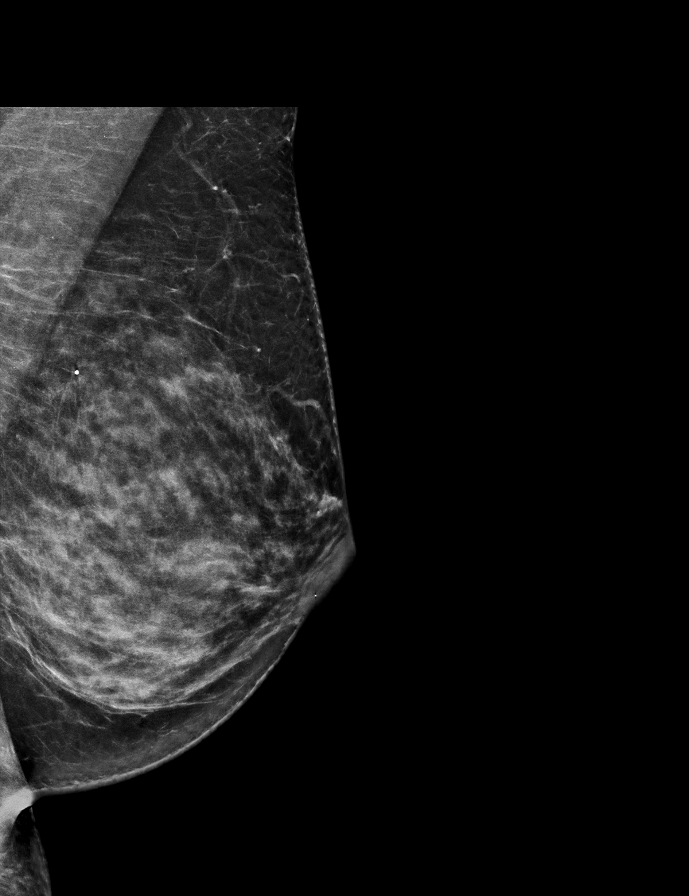

[L CC synth-2D]
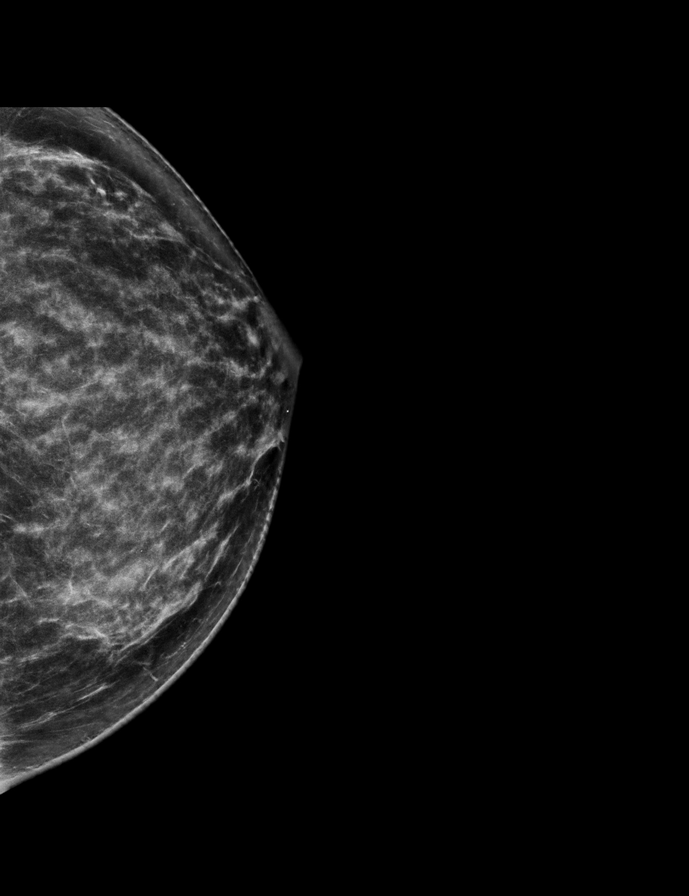

[R MLO tomo · 2 of 71 frames shown]
[frame 23/71]
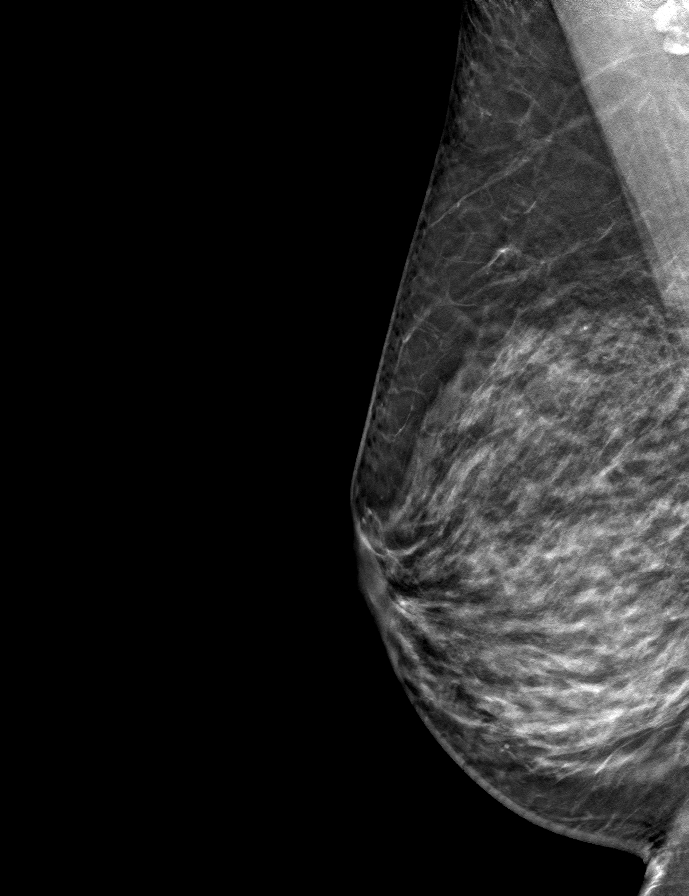
[frame 36/71]
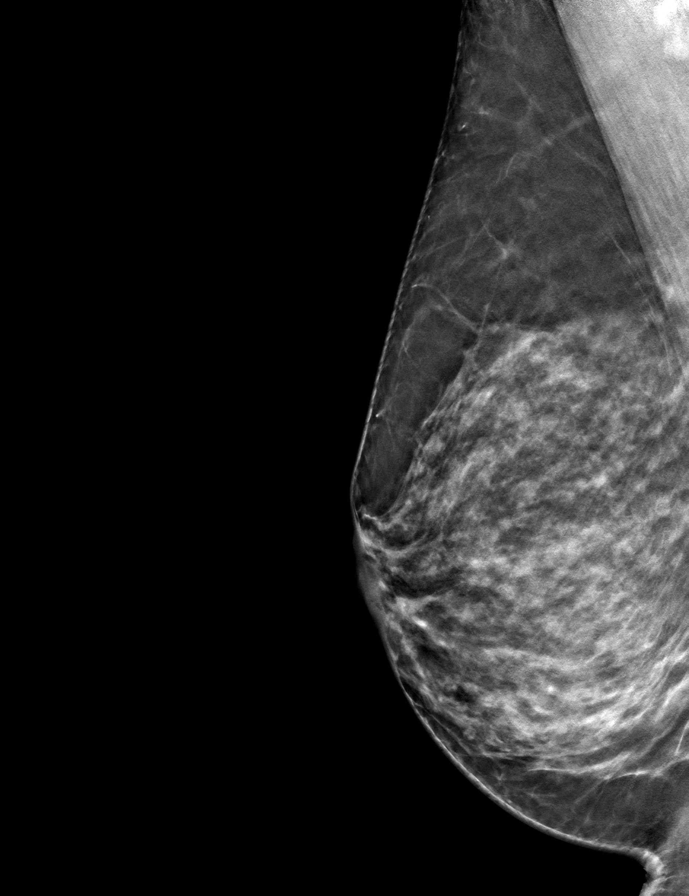

[L MLO tomo · tomo slice 37/74.0]
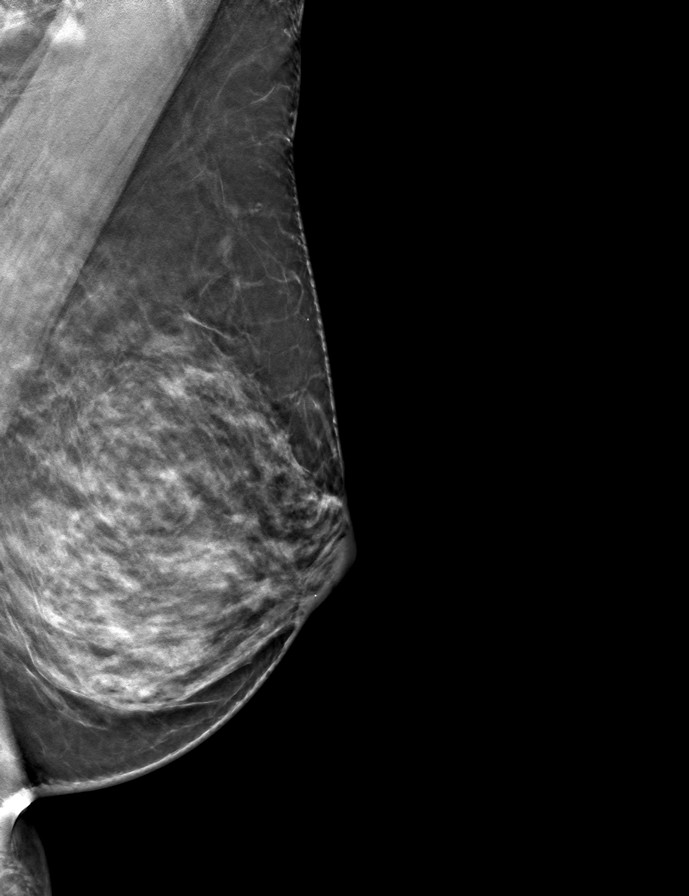

[R CC tomo · tomo slice 35/70.0]
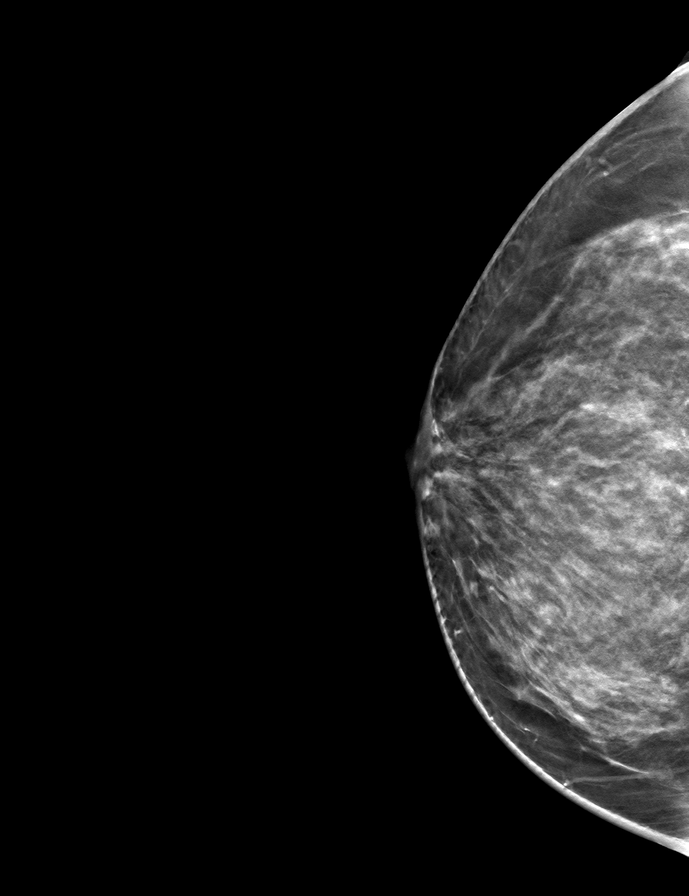

[L CC tomo · tomo slice 35/69.0]
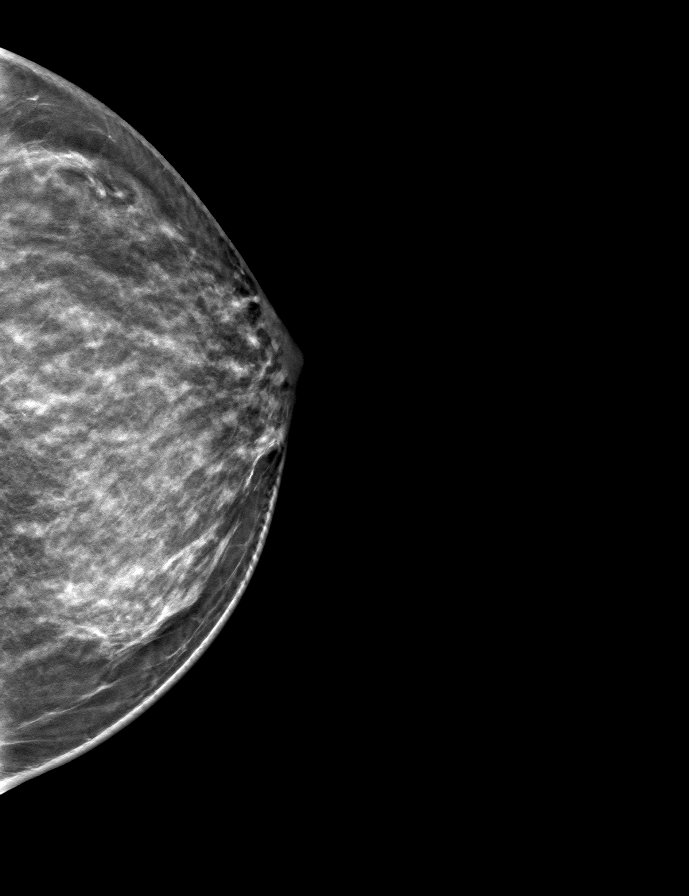

[9 of 24 positions shown; findings below may reference images not displayed]

ACR Breast Density Category d: The breast tissue is extremely dense,
which lowers the sensitivity of mammography
FINDINGS: There are no findings suspicious for malignancy. Images were
processed with CAD.
IMPRESSION: No mammographic evidence of malignancy. A result letter of this
screening mammogram will be mailed directly to the patient.

RECOMMENDATION:
Screening mammogram in one year. (Code:[id number]-[id number])

BI-RADS CATEGORY  1: Negative.

## 2019-06-01 DIAGNOSIS — K219 Gastro-esophageal reflux disease without esophagitis: Secondary | ICD-10-CM | POA: Diagnosis not present

## 2019-06-01 DIAGNOSIS — K5730 Diverticulosis of large intestine without perforation or abscess without bleeding: Secondary | ICD-10-CM | POA: Diagnosis not present

## 2019-06-01 DIAGNOSIS — R630 Anorexia: Secondary | ICD-10-CM | POA: Diagnosis not present

## 2019-06-01 DIAGNOSIS — R110 Nausea: Secondary | ICD-10-CM | POA: Diagnosis not present

## 2019-06-01 DIAGNOSIS — R634 Abnormal weight loss: Secondary | ICD-10-CM | POA: Diagnosis not present

## 2019-06-11 DIAGNOSIS — N281 Cyst of kidney, acquired: Secondary | ICD-10-CM | POA: Diagnosis not present

## 2019-06-11 DIAGNOSIS — R634 Abnormal weight loss: Secondary | ICD-10-CM | POA: Diagnosis not present

## 2019-06-11 DIAGNOSIS — R630 Anorexia: Secondary | ICD-10-CM | POA: Diagnosis not present

## 2019-06-11 DIAGNOSIS — R110 Nausea: Secondary | ICD-10-CM | POA: Diagnosis not present

## 2019-06-11 DIAGNOSIS — K5730 Diverticulosis of large intestine without perforation or abscess without bleeding: Secondary | ICD-10-CM | POA: Diagnosis not present

## 2019-06-11 DIAGNOSIS — D259 Leiomyoma of uterus, unspecified: Secondary | ICD-10-CM | POA: Diagnosis not present

## 2019-06-11 DIAGNOSIS — R918 Other nonspecific abnormal finding of lung field: Secondary | ICD-10-CM | POA: Diagnosis not present

## 2019-06-11 DIAGNOSIS — K760 Fatty (change of) liver, not elsewhere classified: Secondary | ICD-10-CM | POA: Diagnosis not present

## 2019-06-11 DIAGNOSIS — K219 Gastro-esophageal reflux disease without esophagitis: Secondary | ICD-10-CM | POA: Diagnosis not present

## 2019-07-21 DIAGNOSIS — R918 Other nonspecific abnormal finding of lung field: Secondary | ICD-10-CM | POA: Diagnosis not present

## 2019-07-21 DIAGNOSIS — R911 Solitary pulmonary nodule: Secondary | ICD-10-CM | POA: Diagnosis not present

## 2020-01-15 ENCOUNTER — Other Ambulatory Visit (HOSPITAL_COMMUNITY): Payer: Self-pay | Admitting: Family Medicine

## 2020-01-15 ENCOUNTER — Other Ambulatory Visit: Payer: Self-pay | Admitting: Family Medicine

## 2020-01-15 DIAGNOSIS — R634 Abnormal weight loss: Secondary | ICD-10-CM

## 2020-01-15 DIAGNOSIS — R630 Anorexia: Secondary | ICD-10-CM

## 2020-01-15 DIAGNOSIS — R110 Nausea: Secondary | ICD-10-CM

## 2020-02-07 ENCOUNTER — Encounter (HOSPITAL_COMMUNITY): Payer: Self-pay

## 2020-02-07 ENCOUNTER — Encounter (HOSPITAL_COMMUNITY): Payer: BC Managed Care – PPO

## 2020-02-13 ENCOUNTER — Ambulatory Visit (HOSPITAL_COMMUNITY)
Admission: RE | Admit: 2020-02-13 | Discharge: 2020-02-13 | Disposition: A | Discharge status: Home / Self Care | Payer: BC Managed Care – PPO | Source: Ambulatory Visit | Attending: Family Medicine | Admitting: Family Medicine

## 2020-02-13 ENCOUNTER — Other Ambulatory Visit: Payer: Self-pay

## 2020-02-13 DIAGNOSIS — R110 Nausea: Secondary | ICD-10-CM

## 2020-02-13 DIAGNOSIS — R630 Anorexia: Secondary | ICD-10-CM

## 2020-02-13 DIAGNOSIS — R634 Abnormal weight loss: Secondary | ICD-10-CM | POA: Diagnosis present

## 2020-02-13 MED ORDER — TECHNETIUM TC 99M MEBROFENIN IV KIT
5.2000 | PACK | Freq: Once | INTRAVENOUS | Status: AC | PRN
  Administered 2020-02-13: 5.2 via INTRAVENOUS

## 2020-02-20 ENCOUNTER — Other Ambulatory Visit: Payer: Self-pay | Admitting: Family Medicine

## 2020-04-02 ENCOUNTER — Other Ambulatory Visit: Payer: Self-pay | Admitting: Obstetrics and Gynecology

## 2020-04-02 ENCOUNTER — Other Ambulatory Visit: Payer: Self-pay | Admitting: Family Medicine

## 2020-04-02 DIAGNOSIS — R5381 Other malaise: Secondary | ICD-10-CM

## 2020-04-03 ENCOUNTER — Other Ambulatory Visit: Payer: Self-pay | Admitting: Family Medicine

## 2020-04-03 DIAGNOSIS — N644 Mastodynia: Secondary | ICD-10-CM

## 2020-05-23 ENCOUNTER — Other Ambulatory Visit: Payer: Self-pay | Admitting: Obstetrics and Gynecology

## 2020-05-23 DIAGNOSIS — N951 Menopausal and female climacteric states: Secondary | ICD-10-CM

## 2020-06-06 ENCOUNTER — Ambulatory Visit: Payer: BC Managed Care – PPO

## 2020-06-06 ENCOUNTER — Other Ambulatory Visit: Payer: Self-pay

## 2020-06-06 ENCOUNTER — Ambulatory Visit
Admission: RE | Admit: 2020-06-06 | Discharge: 2020-06-06 | Disposition: A | Discharge status: Home / Self Care | Payer: BC Managed Care – PPO | Source: Ambulatory Visit | Attending: Family Medicine | Admitting: Family Medicine

## 2020-06-06 DIAGNOSIS — N644 Mastodynia: Secondary | ICD-10-CM

## 2020-11-06 ENCOUNTER — Ambulatory Visit
Admission: RE | Admit: 2020-11-06 | Discharge: 2020-11-06 | Disposition: A | Discharge status: Home / Self Care | Payer: 59 | Source: Ambulatory Visit | Attending: Obstetrics and Gynecology | Admitting: Obstetrics and Gynecology

## 2020-11-06 ENCOUNTER — Other Ambulatory Visit: Payer: Self-pay

## 2020-11-06 DIAGNOSIS — N951 Menopausal and female climacteric states: Secondary | ICD-10-CM

## 2021-05-07 ENCOUNTER — Other Ambulatory Visit: Payer: Self-pay | Admitting: Family Medicine

## 2021-05-07 DIAGNOSIS — Z1231 Encounter for screening mammogram for malignant neoplasm of breast: Secondary | ICD-10-CM

## 2021-06-09 ENCOUNTER — Ambulatory Visit
Admission: RE | Admit: 2021-06-09 | Discharge: 2021-06-09 | Disposition: A | Discharge status: Home / Self Care | Payer: 59 | Source: Ambulatory Visit | Attending: Family Medicine | Admitting: Family Medicine

## 2021-06-09 DIAGNOSIS — Z1231 Encounter for screening mammogram for malignant neoplasm of breast: Secondary | ICD-10-CM

## 2022-02-19 ENCOUNTER — Other Ambulatory Visit: Payer: Self-pay | Admitting: Family Medicine

## 2022-02-19 DIAGNOSIS — R911 Solitary pulmonary nodule: Secondary | ICD-10-CM

## 2022-03-22 ENCOUNTER — Encounter: Payer: Self-pay | Admitting: Family Medicine

## 2022-03-23 ENCOUNTER — Ambulatory Visit
Admission: RE | Admit: 2022-03-23 | Discharge: 2022-03-23 | Disposition: A | Discharge status: Home / Self Care | Payer: 59 | Source: Ambulatory Visit | Attending: Family Medicine | Admitting: Family Medicine

## 2022-03-23 DIAGNOSIS — R911 Solitary pulmonary nodule: Secondary | ICD-10-CM

## 2022-06-01 ENCOUNTER — Other Ambulatory Visit: Payer: Self-pay | Admitting: Obstetrics and Gynecology

## 2022-06-01 DIAGNOSIS — Z1231 Encounter for screening mammogram for malignant neoplasm of breast: Secondary | ICD-10-CM

## 2022-06-15 ENCOUNTER — Ambulatory Visit: Payer: 59

## 2022-07-20 ENCOUNTER — Ambulatory Visit: Payer: 59

## 2022-07-30 ENCOUNTER — Ambulatory Visit
Admission: RE | Admit: 2022-07-30 | Discharge: 2022-07-30 | Disposition: A | Discharge status: Home / Self Care | Payer: 59 | Source: Ambulatory Visit | Attending: Obstetrics and Gynecology | Admitting: Obstetrics and Gynecology

## 2022-07-30 DIAGNOSIS — Z1231 Encounter for screening mammogram for malignant neoplasm of breast: Secondary | ICD-10-CM

## 2023-02-16 ENCOUNTER — Other Ambulatory Visit: Payer: Self-pay | Admitting: Family Medicine

## 2023-02-16 DIAGNOSIS — R911 Solitary pulmonary nodule: Secondary | ICD-10-CM

## 2023-03-16 ENCOUNTER — Ambulatory Visit
Admission: RE | Admit: 2023-03-16 | Discharge: 2023-03-16 | Disposition: A | Discharge status: Home / Self Care | Payer: 59 | Source: Ambulatory Visit | Attending: Family Medicine | Admitting: Family Medicine

## 2023-03-16 DIAGNOSIS — R911 Solitary pulmonary nodule: Secondary | ICD-10-CM

## 2023-05-20 ENCOUNTER — Other Ambulatory Visit: Payer: Self-pay | Admitting: Obstetrics and Gynecology

## 2023-05-20 DIAGNOSIS — Z1231 Encounter for screening mammogram for malignant neoplasm of breast: Secondary | ICD-10-CM

## 2023-08-02 ENCOUNTER — Ambulatory Visit

## 2023-08-04 ENCOUNTER — Ambulatory Visit

## 2023-08-16 ENCOUNTER — Ambulatory Visit
Admission: RE | Admit: 2023-08-16 | Discharge: 2023-08-16 | Disposition: A | Discharge status: Home / Self Care | Source: Ambulatory Visit | Attending: Obstetrics and Gynecology | Admitting: Obstetrics and Gynecology

## 2023-08-16 DIAGNOSIS — Z1231 Encounter for screening mammogram for malignant neoplasm of breast: Secondary | ICD-10-CM
